# Patient Record
Sex: Female | Born: 1960 | Race: White | Hispanic: No | Marital: Married | Smoking: Former smoker
Health system: Southern US, Community
[De-identification: ages and names within clinical notes are randomized; demographics above are authoritative.]

## PROBLEM LIST (undated history)

## (undated) DIAGNOSIS — E785 Hyperlipidemia, unspecified: Secondary | ICD-10-CM

## (undated) DIAGNOSIS — H3321 Serous retinal detachment, right eye: Secondary | ICD-10-CM

## (undated) HISTORY — DX: Hyperlipidemia, unspecified: E78.5

## (undated) HISTORY — PX: OTHER SURGICAL HISTORY: SHX169

## (undated) HISTORY — PX: WISDOM TOOTH EXTRACTION: SHX21

## (undated) HISTORY — DX: Serous retinal detachment, right eye: H33.21

---

## 2014-09-21 ENCOUNTER — Telehealth: Payer: Self-pay | Admitting: *Deleted

## 2014-09-21 NOTE — Telephone Encounter (Signed)
Pre visit completed 

## 2014-09-23 ENCOUNTER — Ambulatory Visit (INDEPENDENT_AMBULATORY_CARE_PROVIDER_SITE_OTHER): Payer: 59 | Admitting: Family

## 2014-09-23 ENCOUNTER — Encounter: Payer: Self-pay | Admitting: Family

## 2014-09-23 ENCOUNTER — Ambulatory Visit (HOSPITAL_BASED_OUTPATIENT_CLINIC_OR_DEPARTMENT_OTHER)
Admission: RE | Admit: 2014-09-23 | Discharge: 2014-09-23 | Disposition: A | Discharge status: Home / Self Care | Payer: 59 | Source: Ambulatory Visit | Attending: Family | Admitting: Family

## 2014-09-23 VITALS — BP 110/70 | HR 73 | Temp 98.0°F | Resp 16 | Ht 65.0 in | Wt 224.2 lb

## 2014-09-23 DIAGNOSIS — Z1231 Encounter for screening mammogram for malignant neoplasm of breast: Secondary | ICD-10-CM | POA: Insufficient documentation

## 2014-09-23 DIAGNOSIS — E785 Hyperlipidemia, unspecified: Secondary | ICD-10-CM | POA: Diagnosis not present

## 2014-09-23 DIAGNOSIS — Z1211 Encounter for screening for malignant neoplasm of colon: Secondary | ICD-10-CM

## 2014-09-23 DIAGNOSIS — R011 Cardiac murmur, unspecified: Secondary | ICD-10-CM

## 2014-09-23 DIAGNOSIS — Z0000 Encounter for general adult medical examination without abnormal findings: Secondary | ICD-10-CM | POA: Diagnosis not present

## 2014-09-23 DIAGNOSIS — E2839 Other primary ovarian failure: Secondary | ICD-10-CM

## 2014-09-23 LAB — URINALYSIS, ROUTINE W REFLEX MICROSCOPIC
Bilirubin Urine: NEGATIVE
Hgb urine dipstick: NEGATIVE
Ketones, ur: NEGATIVE
Nitrite: POSITIVE — AB
SPECIFIC GRAVITY, URINE: 1.015 (ref 1.000–1.030)
TOTAL PROTEIN, URINE-UPE24: NEGATIVE
URINE GLUCOSE: NEGATIVE
Urobilinogen, UA: 0.2 (ref 0.0–1.0)
pH: 7.5 (ref 5.0–8.0)

## 2014-09-23 LAB — CBC WITH DIFFERENTIAL/PLATELET
BASOS ABS: 0.1 10*3/uL (ref 0.0–0.1)
BASOS PCT: 0.9 % (ref 0.0–3.0)
Eosinophils Absolute: 0.3 10*3/uL (ref 0.0–0.7)
Eosinophils Relative: 3.1 % (ref 0.0–5.0)
HCT: 45.6 % (ref 36.0–46.0)
Hemoglobin: 15.4 g/dL — ABNORMAL HIGH (ref 12.0–15.0)
LYMPHS PCT: 30.3 % (ref 12.0–46.0)
Lymphs Abs: 2.5 10*3/uL (ref 0.7–4.0)
MCHC: 33.8 g/dL (ref 30.0–36.0)
MCV: 90.3 fl (ref 78.0–100.0)
Monocytes Absolute: 0.6 10*3/uL (ref 0.1–1.0)
Monocytes Relative: 7.7 % (ref 3.0–12.0)
NEUTROS PCT: 58 % (ref 43.0–77.0)
Neutro Abs: 4.8 10*3/uL (ref 1.4–7.7)
Platelets: 338 10*3/uL (ref 150.0–400.0)
RBC: 5.05 Mil/uL (ref 3.87–5.11)
RDW: 13 % (ref 11.5–15.5)
WBC: 8.3 10*3/uL (ref 4.0–10.5)

## 2014-09-23 LAB — LIPID PANEL
Cholesterol: 193 mg/dL (ref 0–200)
HDL: 60.6 mg/dL (ref >39.00)
LDL CALC: 117 mg/dL — AB (ref 0–99)
NonHDL: 132.4
TRIGLYCERIDES: 75 mg/dL (ref 0.0–149.0)
Total CHOL/HDL Ratio: 3
VLDL: 15 mg/dL (ref 0.0–40.0)

## 2014-09-23 LAB — HEPATIC FUNCTION PANEL
ALT: 17 U/L (ref 0–35)
AST: 17 U/L (ref 0–37)
Albumin: 4.2 g/dL (ref 3.5–5.2)
Alkaline Phosphatase: 81 U/L (ref 39–117)
BILIRUBIN DIRECT: 0.1 mg/dL (ref 0.0–0.3)
Total Bilirubin: 0.7 mg/dL (ref 0.2–1.2)
Total Protein: 7.2 g/dL (ref 6.0–8.3)

## 2014-09-23 LAB — BASIC METABOLIC PANEL
BUN: 15 mg/dL (ref 6–23)
CALCIUM: 9.7 mg/dL (ref 8.4–10.5)
CO2: 30 mEq/L (ref 19–32)
CREATININE: 0.88 mg/dL (ref 0.40–1.20)
Chloride: 104 mEq/L (ref 96–112)
GFR: 70.96 mL/min (ref >60.00)
Glucose, Bld: 91 mg/dL (ref 70–99)
Potassium: 4.4 mEq/L (ref 3.5–5.1)
Sodium: 139 mEq/L (ref 135–145)

## 2014-09-23 LAB — TSH: TSH: 0.53 u[IU]/mL (ref 0.35–4.50)

## 2014-09-23 MED ORDER — SIMVASTATIN 20 MG PO TABS: 20.0000 mg | ORAL_TABLET | Freq: Every day | ORAL | Status: DC

## 2014-09-23 NOTE — Patient Instructions (Addendum)
Complete lab work prior to leaving.  Try downloading my fitness pal app and counting calories with goal weight loss of 1-2 pounds a week.  Add caltrate 600mg  + D twice daily Please complete lab work prior to leaving. You will be contacted about your bone density.  Please let us know if you have not heard back within 1 week about your referral. Schedule mammogram on the first floor in the imaging department.  Welcome to [facility]!

## 2014-09-23 NOTE — Progress Notes (Signed)
Pre visit review using our clinic review tool, if applicable. No additional management support is needed unless otherwise documented below in the visit note. 

## 2014-09-23 NOTE — Progress Notes (Signed)
Subjective:    Patient ID: Whitney Moore, female    DOB: 01/25/1961, 54 y.o.   MRN: [id number]  HPI  Ms.  Whitney Moore is a 54 yr old female who presents today to establish care.   Hyperlipidemia- Patient is currently maintained on the following medication for hyperlipidemia: simvastatin Last lipid panel as follows: Patient denies myalgia. Patient reports non-compliance with low fat/low cholesterol diet.  BP Readings from Last 3 Encounters:  09/23/14 110/70    Immunizations: tetanus 7 yrs ago Diet: needs to be improved.   Exercise:walking Colonoscopy: never (wished to defer for now due to finances) Dexa: never, LMP 51  Pap Smear: 2014- normal per pt Mammogram: 2014- due Dental: up to date Eye: exam 11/15   Weight management- Reports that she her weight was 185-190 3-4 years ago. Wants to lose 30 pounds by august for trip to [place].  She reports that she lost 7 pounds when taking wellbutrin during the holidays.   Wt Readings from Last 3 Encounters:  09/23/14 224 lb 3.2 oz (101.696 kg)    Murmur-  Reports "since I was a child."  Mother has hx of valvular heart disease.  Reports she has not had an echo in "years."    Review of Systems  Constitutional: Negative for unexpected weight change.  HENT: Negative for hearing loss and rhinorrhea.   Eyes: Negative for visual disturbance.  Respiratory: Negative for cough and shortness of breath.   Cardiovascular: Negative for leg swelling.  Gastrointestinal: Negative for nausea, diarrhea and constipation.  Genitourinary: Negative for dysuria and frequency.       Reports mild yeast infection- using monistat  Musculoskeletal: Negative for myalgias and arthralgias.  Skin: Negative for rash.  Neurological:       Reports rare migraine (every few years)  Hematological: Negative for adenopathy.  Psychiatric/Behavioral: Negative for dysphoric mood and agitation.   Past Medical History  Diagnosis Date  . Hyperlipidemia     History    Social History  . Marital Status: Married    Spouse Name: N/A  . Number of Children: N/A  . Years of Education: N/A   Occupational History  . Not on file.   Social History Main Topics  . Smoking status: Former Smoker    Quit date: 09/22/1997  . Smokeless tobacco: Never Used  . Alcohol Use: No  . Drug Use: No  . Sexual Activity: Not on file   Other Topics Concern  . 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 Wisdom tooth extraction      Family History  Problem Relation Age of Onset  . Heart disease Mother     metal heart valve, pacemaker    No Known Allergies  No current outpatient prescriptions on file prior to visit.   No current facility-administered medications on file prior to visit.    BP 110/70 mmHg  Pulse 73  Temp(Src) 98 F (36.7 C) (Oral)  Resp 16  Ht 5\' 5"  (1.651 m)  Wt 224 lb 3.2 oz (101.696 kg)  BMI 37.31 kg/m2  SpO2 98%  LMP 09/23/2011       Objective:   Physical Exam Physical Exam  Constitutional: She is oriented to person, place, and time. She appears well-developed and well-nourished. No distress.  HENT:  Head: Normocephalic and atraumatic.  Right Ear: Tympanic membrane and ear canal normal.  Left Ear: Tympanic membrane and ear canal normal.  Mouth/Throat: Oropharynx is clear and moist.  Eyes: Pupils are equal, round, and reactive to light. No scleral icterus.  Neck: Normal range of motion. No thyromegaly present.  Cardiovascular: Normal rate and regular rhythm. 1+ systolic murmur Pulmonary/Chest: Effort normal and breath sounds normal. No respiratory distress. He has no wheezes. She has no rales. She exhibits no tenderness.  Abdominal: Soft. Bowel sounds are normal. He exhibits no distension and no mass. There is no tenderness. There is no rebound and no guarding.   Musculoskeletal: She exhibits no edema.  Lymphadenopathy:    She has no cervical adenopathy.  Neurological: She is alert and oriented to person, place, and time. She has normal reflexes. She exhibits normal muscle tone. Coordination normal.  Skin: Skin is warm and dry. tanned Psychiatric: She has a normal mood and affect. Her behavior is normal. Judgment and thought content normal.  Breasts: Examined lying Right: Without masses, retractions, discharge or axillary adenopathy.  Left: Without masses, retractions, discharge or axillary adenopathy.  Pelvic: deferred       Assessment & Plan:          Assessment & Plan:

## 2014-09-23 NOTE — Assessment & Plan Note (Addendum)
Immunizations reviewed and up to date.  discussed healthy diet, exercise, weight loss.  Obtain routine labs. Declines HIV screen.  Discussed using my fitness pal to assist with weigh loss.  Refer for mammo, dexa, declines colo at this time.  Will obtain IFOB.

## 2014-09-23 NOTE — Assessment & Plan Note (Signed)
Will obtain 2D echo to further evaluate.

## 2014-09-23 NOTE — Assessment & Plan Note (Addendum)
Obtain lipid panel. Continue statin.  

## 2014-09-25 ENCOUNTER — Encounter: Payer: Self-pay | Admitting: Family

## 2014-09-26 ENCOUNTER — Ambulatory Visit (INDEPENDENT_AMBULATORY_CARE_PROVIDER_SITE_OTHER): Payer: 59 | Admitting: Family

## 2014-09-26 ENCOUNTER — Encounter: Payer: Self-pay | Admitting: Family

## 2014-09-26 ENCOUNTER — Other Ambulatory Visit (HOSPITAL_COMMUNITY)
Admission: RE | Admit: 2014-09-26 | Discharge: 2014-09-26 | Disposition: A | Discharge status: Home / Self Care | Payer: 59 | Source: Ambulatory Visit | Attending: Family | Admitting: Family

## 2014-09-26 VITALS — BP 110/78 | HR 80 | Temp 98.1°F | Resp 16 | Ht 65.0 in | Wt 226.0 lb

## 2014-09-26 DIAGNOSIS — R350 Frequency of micturition: Secondary | ICD-10-CM

## 2014-09-26 DIAGNOSIS — L298 Other pruritus: Secondary | ICD-10-CM

## 2014-09-26 DIAGNOSIS — N760 Acute vaginitis: Secondary | ICD-10-CM | POA: Insufficient documentation

## 2014-09-26 DIAGNOSIS — N898 Other specified noninflammatory disorders of vagina: Secondary | ICD-10-CM

## 2014-09-26 NOTE — Progress Notes (Signed)
   Subjective:    Patient ID: Whitney RousselRebecca Moore, female    DOB: 10/30/1960, 54 y.o.   MRN: [id number]  HPI   Ms. Whitney Moore is a 54 yr old female who presents today with complaint of vaginal itching despite monistat. Denies odor.  Denies dysuria, fever, dysuria or hematuria.     Review of Systems See HPI  Past Medical History  Diagnosis Date  . Hyperlipidemia     History   Social History  . Marital Status: Married    Spouse Name: N/A  . Number of Children: N/A  . Years of Education: N/A   Occupational History  . Not on file.   Social History Main Topics  . Smoking status: Former Smoker    Quit date: 09/22/1997  . Smokeless tobacco: Never Used  . Alcohol Use: No  . Drug Use: No  . Sexual Activity: Not on file   Other Topics Concern  . 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 Wisdom tooth extraction      Family History  Problem Relation Age of Onset  . Heart disease Mother     metal heart valve, pacemaker    No Known Allergies  Current Outpatient Prescriptions on File Prior to Visit  Medication Sig Dispense Refill  . fexofenadine (ALLEGRA) 180 MG tablet Take 180 mg by mouth daily.    . simvastatin (ZOCOR) 20 MG tablet Take 1 tablet (20 mg total) by mouth at bedtime. 90 tablet 1   No current facility-administered medications on file prior to visit.    BP 110/78 mmHg  Pulse 80  Temp(Src) 98.1 F (36.7 C) (Oral)  Resp 16  Ht 5\' 5"  (1.651 m)  Wt 226 lb (102.513 kg)  BMI 37.61 kg/m2  SpO2 98%  LMP 09/23/2011       Objective:   Physical Exam  Constitutional: She appears well-developed and well-nourished.  Cardiovascular: Normal rate, regular rhythm and normal heart sounds.   No murmur heard. Pulmonary/Chest: Effort normal and breath sounds normal. No respiratory distress. She has no  wheezes.  Genitourinary: There is no rash on the right labia. There is no rash on the left labia. No vaginal discharge found.  Psychiatric: She has a normal mood and affect. Her behavior is normal. Judgment and thought content normal.          Assessment & Plan:

## 2014-09-26 NOTE — Telephone Encounter (Signed)
I would recommend she come in for a wet prep so we can see if it is yeast or not. If monistat does not work then we need to treat with different med.  It would also be a good idea to get a urine culture since ua showed possible UTI.

## 2014-09-26 NOTE — Progress Notes (Signed)
Pre visit review using our clinic review tool, if applicable. No additional management support is needed unless otherwise documented below in the visit note. 

## 2014-09-26 NOTE — Telephone Encounter (Signed)
See previous phone note. Pt will see Provider today at 6pm.

## 2014-09-26 NOTE — Telephone Encounter (Signed)
Left message on cell# for pt to call and arrange appt.

## 2014-09-26 NOTE — Patient Instructions (Signed)
We will contact you with your lab results. 

## 2014-09-26 NOTE — Telephone Encounter (Signed)
Notified pt and she scheduled appt for this evening at 6pm.

## 2014-09-28 ENCOUNTER — Encounter: Payer: Self-pay | Admitting: Family

## 2014-09-28 LAB — CERVICOVAGINAL ANCILLARY ONLY: Wet Prep (BD Affirm): NEGATIVE

## 2014-09-29 DIAGNOSIS — N760 Acute vaginitis: Secondary | ICD-10-CM | POA: Insufficient documentation

## 2014-09-29 LAB — URINE CULTURE: Colony Count: 9000

## 2014-09-29 NOTE — Assessment & Plan Note (Signed)
Wet prep negative.  ? Recent yeast infection which is now resolved following monistat.  Another consideration is irritation secondary to vaginal atrophy. Pt to call if symptoms worsen or do not improve.

## 2014-09-30 ENCOUNTER — Encounter: Payer: Self-pay | Admitting: Family

## 2014-09-30 MED ORDER — CIPROFLOXACIN HCL 500 MG PO TABS: 500.0000 mg | ORAL_TABLET | Freq: Two times a day (BID) | ORAL | Status: DC

## 2015-04-24 ENCOUNTER — Other Ambulatory Visit: Payer: Self-pay | Admitting: Family

## 2015-04-24 NOTE — Telephone Encounter (Signed)
Please call pt to schedule fasting physical before further refills are due.  Thanks!

## 2015-04-24 NOTE — Telephone Encounter (Signed)
Looks like she is due for CPX at her convenience.

## 2015-04-24 NOTE — Telephone Encounter (Signed)
Whitney Moore-- just sent a 90 day supply of simvastatin to pharmacy. Pt's last visit 09/2014. When should pt follow up in the office?

## 2015-04-25 NOTE — Telephone Encounter (Signed)
Left msg for pt to call and schedule fasting cpe

## 2015-08-09 ENCOUNTER — Telehealth: Payer: 59 | Admitting: Nurse Practitioner

## 2015-08-09 DIAGNOSIS — R059 Cough, unspecified: Secondary | ICD-10-CM

## 2015-08-09 DIAGNOSIS — R05 Cough: Secondary | ICD-10-CM

## 2015-08-09 MED ORDER — BENZONATATE 100 MG PO CAPS: 100.0000 mg | ORAL_CAPSULE | Freq: Three times a day (TID) | ORAL | Status: DC | PRN

## 2015-08-09 MED ORDER — AZITHROMYCIN 250 MG PO TABS: ORAL_TABLET | ORAL | Status: DC

## 2015-08-09 NOTE — Progress Notes (Signed)

## 2015-08-16 ENCOUNTER — Ambulatory Visit (INDEPENDENT_AMBULATORY_CARE_PROVIDER_SITE_OTHER): Payer: Commercial Managed Care - HMO | Admitting: Family

## 2015-08-16 ENCOUNTER — Encounter: Payer: Self-pay | Admitting: Family

## 2015-08-16 VITALS — BP 110/76 | HR 77 | Temp 97.7°F | Ht 65.0 in | Wt 221.6 lb

## 2015-08-16 DIAGNOSIS — E785 Hyperlipidemia, unspecified: Secondary | ICD-10-CM | POA: Diagnosis not present

## 2015-08-16 DIAGNOSIS — J209 Acute bronchitis, unspecified: Secondary | ICD-10-CM

## 2015-08-16 MED ORDER — SIMVASTATIN 20 MG PO TABS: ORAL_TABLET | ORAL | Status: DC

## 2015-08-16 NOTE — Progress Notes (Signed)
Pre visit review using our clinic review tool, if applicable. No additional management support is needed unless otherwise documented below in the visit note. 

## 2015-08-16 NOTE — Assessment & Plan Note (Signed)
Tolerating statin, working on diet, exercise, weight loss.

## 2015-08-16 NOTE — Patient Instructions (Signed)
Please schedule a complete physical after 09/23/15. Continue simvastatin.

## 2015-08-16 NOTE — Progress Notes (Signed)
   Subjective:    Patient ID: Whitney Moore, female    DOB: 11/30/1960, 55 y.o.   MRN: [id number]  HPI  Ms. Whitney Moore is a 55 yr old female who presents today for follow up of her cholesterol. She is maintained on simvastatin. Denies myalgia.  Reports that she is trying to work on diet.  She has joined the gym. Denies myalgia.  Lab Results  Component Value Date   CHOL 193 09/23/2014   HDL 60.60 09/23/2014   LDLCALC 117* 09/23/2014   TRIG 75.0 09/23/2014   CHOLHDL 3 09/23/2014     Had E-visit on 08/09/15 for bronchitis- was treated with zpak and tessalon. Reports improvement in her symptoms.   Review of Systems See HPI  Past Medical History  Diagnosis Date  . Hyperlipidemia     Social History   Social History  . Marital Status: Married    Spouse Name: N/A  . Number of Children: N/A  . Years of Education: N/A   Occupational History  . Not on file.   Social History Main Topics  . Smoking status: Former Smoker    Quit date: 09/22/1997  . Smokeless tobacco: Never Used  . Alcohol Use: No  . Drug Use: No  . Sexual Activity: Not on file   Other Topics Concern  . 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 Wisdom tooth extraction      Family History  Problem Relation Age of Onset  . Heart disease Mother     metal heart valve, pacemaker    No Known Allergies  Current Outpatient Prescriptions on File Prior to Visit  Medication Sig Dispense Refill  . benzonatate (TESSALON PERLES) 100 MG capsule Take 1 capsule (100 mg total) by mouth 3 (three) times daily as needed for cough. 20 capsule 0   No current facility-administered medications on file prior to visit.    BP 110/76 mmHg  Pulse 77  Temp(Src) 97.7 F (36.5 C) (Oral)  Ht 5\' 5"  (1.651 m)  Wt 221 lb 9.6 oz (100.517 kg)  BMI 36.88 kg/m2  SpO2  98%  LMP 09/23/2011       Objective:   Physical Exam  Constitutional: She is oriented to person, place, and time. She appears well-developed and well-nourished.  Cardiovascular: Normal rate, regular rhythm and normal heart sounds.   No murmur heard. Pulmonary/Chest: Effort normal and breath sounds normal. No respiratory distress. She has no wheezes.  Neurological: She is alert and oriented to person, place, and time.  Psychiatric: She has a normal mood and affect. Her behavior is normal. Judgment and thought content normal.          Assessment & Plan:  Acute bronchitis- clinically resolved.  Monitor.

## 2016-02-06 ENCOUNTER — Telehealth: Payer: Self-pay | Admitting: Family

## 2016-02-06 NOTE — Telephone Encounter (Signed)
Please schedule pt an annual exam Thank you----PC

## 2016-02-08 NOTE — Telephone Encounter (Signed)
Called pt to schedule cpe. Spouse took a message, pt will call our office back.

## 2016-05-04 ENCOUNTER — Other Ambulatory Visit: Payer: Self-pay | Admitting: Family

## 2016-05-06 NOTE — Telephone Encounter (Signed)
Last seen 08/16/15 Refilled Rx #90 tablets #0 refills until patient follow up after 09/23/15. TL/CMA

## 2016-07-29 ENCOUNTER — Other Ambulatory Visit: Payer: Self-pay | Admitting: Family Medicine

## 2016-10-25 ENCOUNTER — Other Ambulatory Visit: Payer: Self-pay | Admitting: Family

## 2016-12-12 ENCOUNTER — Other Ambulatory Visit (HOSPITAL_COMMUNITY)
Admission: RE | Admit: 2016-12-12 | Discharge: 2016-12-12 | Disposition: A | Discharge status: Home / Self Care | Payer: 59 | Source: Ambulatory Visit | Attending: Family | Admitting: Family

## 2016-12-12 ENCOUNTER — Encounter: Payer: Self-pay | Admitting: Family

## 2016-12-12 ENCOUNTER — Ambulatory Visit (INDEPENDENT_AMBULATORY_CARE_PROVIDER_SITE_OTHER): Payer: 59 | Admitting: Family

## 2016-12-12 VITALS — BP 122/88 | HR 78 | Temp 98.1°F | Resp 16 | Ht 64.5 in | Wt 218.4 lb

## 2016-12-12 DIAGNOSIS — R011 Cardiac murmur, unspecified: Secondary | ICD-10-CM

## 2016-12-12 DIAGNOSIS — Z01419 Encounter for gynecological examination (general) (routine) without abnormal findings: Secondary | ICD-10-CM

## 2016-12-12 DIAGNOSIS — E348 Other specified endocrine disorders: Secondary | ICD-10-CM

## 2016-12-12 DIAGNOSIS — Z0001 Encounter for general adult medical examination with abnormal findings: Secondary | ICD-10-CM

## 2016-12-12 DIAGNOSIS — Z0000 Encounter for general adult medical examination without abnormal findings: Secondary | ICD-10-CM

## 2016-12-12 LAB — BASIC METABOLIC PANEL
BUN: 18 mg/dL (ref 6–23)
CHLORIDE: 105 meq/L (ref 96–112)
CO2: 29 meq/L (ref 19–32)
Calcium: 9.8 mg/dL (ref 8.4–10.5)
Creatinine, Ser: 0.84 mg/dL (ref 0.40–1.20)
GFR: 74.27 mL/min (ref >60.00)
GLUCOSE: 92 mg/dL (ref 70–99)
POTASSIUM: 3.9 meq/L (ref 3.5–5.1)
SODIUM: 139 meq/L (ref 135–145)

## 2016-12-12 LAB — LIPID PANEL
CHOLESTEROL: 190 mg/dL (ref 0–200)
HDL: 65.4 mg/dL (ref >39.00)
LDL Cholesterol: 106 mg/dL — ABNORMAL HIGH (ref 0–99)
NonHDL: 124.57
Total CHOL/HDL Ratio: 3
Triglycerides: 94 mg/dL (ref 0.0–149.0)
VLDL: 18.8 mg/dL (ref 0.0–40.0)

## 2016-12-12 LAB — CBC WITH DIFFERENTIAL/PLATELET
BASOS PCT: 0.9 % (ref 0.0–3.0)
Basophils Absolute: 0.1 10*3/uL (ref 0.0–0.1)
EOS PCT: 2.1 % (ref 0.0–5.0)
Eosinophils Absolute: 0.2 10*3/uL (ref 0.0–0.7)
HCT: 45 % (ref 36.0–46.0)
Hemoglobin: 15.2 g/dL — ABNORMAL HIGH (ref 12.0–15.0)
LYMPHS ABS: 2.1 10*3/uL (ref 0.7–4.0)
Lymphocytes Relative: 25.1 % (ref 12.0–46.0)
MCHC: 33.7 g/dL (ref 30.0–36.0)
MCV: 93.2 fl (ref 78.0–100.0)
MONOS PCT: 7 % (ref 3.0–12.0)
Monocytes Absolute: 0.6 10*3/uL (ref 0.1–1.0)
NEUTROS ABS: 5.4 10*3/uL (ref 1.4–7.7)
NEUTROS PCT: 64.9 % (ref 43.0–77.0)
Platelets: 320 10*3/uL (ref 150.0–400.0)
RBC: 4.83 Mil/uL (ref 3.87–5.11)
RDW: 13.4 % (ref 11.5–15.5)
WBC: 8.4 10*3/uL (ref 4.0–10.5)

## 2016-12-12 LAB — URINALYSIS, ROUTINE W REFLEX MICROSCOPIC
Bilirubin Urine: NEGATIVE
Hgb urine dipstick: NEGATIVE
Ketones, ur: NEGATIVE
Leukocytes, UA: NEGATIVE
Nitrite: NEGATIVE
RBC / HPF: NONE SEEN (ref 0–?)
SPECIFIC GRAVITY, URINE: 1.015 (ref 1.000–1.030)
Total Protein, Urine: NEGATIVE
Urine Glucose: NEGATIVE
Urobilinogen, UA: 0.2 (ref 0.0–1.0)
pH: 7 (ref 5.0–8.0)

## 2016-12-12 LAB — HEPATIC FUNCTION PANEL
ALBUMIN: 4.2 g/dL (ref 3.5–5.2)
ALT: 17 U/L (ref 0–35)
AST: 16 U/L (ref 0–37)
Alkaline Phosphatase: 75 U/L (ref 39–117)
Bilirubin, Direct: 0.1 mg/dL (ref 0.0–0.3)
Total Bilirubin: 0.7 mg/dL (ref 0.2–1.2)
Total Protein: 7.1 g/dL (ref 6.0–8.3)

## 2016-12-12 LAB — TSH: TSH: 0.35 u[IU]/mL (ref 0.35–4.50)

## 2016-12-12 MED ORDER — CALCIUM CARBONATE-VITAMIN D 600-400 MG-UNIT PO TABS: 1.0000 | ORAL_TABLET | Freq: Two times a day (BID) | ORAL | Status: DC

## 2016-12-12 NOTE — Progress Notes (Signed)
Subjective:    Patient ID: Sharol RousselRebecca Nealis, female    DOB: 07/21/1960, 56 y.o.   MRN: [id number]  HPI  Patient presents today for complete physical.  Immunizations: due for tetanus Diet: weight watchers, healthy diet Exercise:  walking Colonoscopy: due Dexa: due Pap Smear: due Mammogram: due Dental: up to date Vision:  1/18    Review of Systems  Constitutional: Negative for unexpected weight change.  HENT: Negative for hearing loss and rhinorrhea.   Eyes: Negative for visual disturbance.  Respiratory: Negative for shortness of breath.        Mild cough due to allergies  Cardiovascular: Negative for chest pain and leg swelling.  Gastrointestinal: Negative for constipation.  Genitourinary: Negative for dysuria and frequency.  Musculoskeletal: Negative for arthralgias and myalgias.  Skin: Negative for rash.  Hematological: Negative for adenopathy.  Psychiatric/Behavioral:       Denies depression   Past Medical History:  Diagnosis Date  . Hyperlipidemia      Social History   Social History  . Marital status: Married    Spouse name: N/A  . Number of children: N/A  . Years of education: N/A   Occupational History  . Not on file.   Social History Main Topics  . Smoking status: Former Smoker    Quit date: 09/22/1997  . Smokeless tobacco: Never Used  . Alcohol use No  . Drug use: No  . Sexual activity: Not on file   Other Topics Concern  . 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 WISDOM TOOTH EXTRACTION      Family History  Problem Relation Age of Onset  . Heart disease Mother        metal heart valve, pacemaker  . Dementia Mother     No Known Allergies  Current Outpatient Prescriptions on File Prior to Visit  Medication Sig Dispense Refill  . simvastatin (ZOCOR) 20 MG tablet  TAKE 1 TABLET(20 MG) BY MOUTH AT BEDTIME 90 tablet 0   No current facility-administered medications on file prior to visit.     BP 122/88 (BP Location: Right Arm, Cuff Size: Large)   Pulse 78   Temp 98.1 F (36.7 C) (Oral)   Resp 16   Ht 5' 4.5" (1.638 m)   Wt 218 lb 6.4 oz (99.1 kg)   LMP 09/23/2011   BMI 36.91 kg/m       Objective:   Physical Exam Physical Exam  Constitutional: She is oriented to person, place, and time. She appears well-developed and well-nourished. No distress.  HENT:  Head: Normocephalic and atraumatic.  Right Ear: Tympanic membrane and ear canal normal.  Left Ear: Tympanic membrane and ear canal normal.  Mouth/Throat: Oropharynx is clear and moist.  Eyes: Pupils are equal, round, and reactive to light. No scleral icterus.  Neck: Normal range of motion. No thyromegaly present.  Cardiovascular: Normal rate and regular rhythm.   2+ systolic  murmur heard. Pulmonary/Chest: Effort normal and breath sounds normal. No respiratory distress. He has no wheezes. She has no rales. She exhibits no tenderness.  Abdominal: Soft. Bowel sounds are normal. She exhibits no distension and no mass. There is no tenderness. There is no rebound and no guarding.  Musculoskeletal: She exhibits no edema.  Lymphadenopathy:    She has no cervical  adenopathy.  Neurological: She is alert and oriented to person, place, and time. She has normal patellar reflexes. She exhibits normal muscle tone. Coordination normal.  Skin: Skin is warm and dry.  Psychiatric: She has a normal mood and affect. Her behavior is normal. Judgment and thought content normal.  Breasts: Examined lying Right: Without masses, retractions, discharge or axillary adenopathy.  Left: Without masses, retractions, discharge or axillary adenopathy.  Inguinal/mons: Normal without inguinal adenopathy  External genitalia: Normal  BUS/Urethra/Skene's glands: Normal  Bladder: Normal  Vagina: Normal  Cervix: Normal    Uterus: normal in size, shape and contour. Midline and mobile  Adnexa/parametria:  Rt: Without masses or tenderness.  Lt: Without masses or tenderness.  Anus and perineum: Normal            Assessment & Plan:        Assessment & Plan:  Preventative care- She states that she has lost 20 pounds. I encouraged her to continue her healthy diet exercise and weight loss efforts. Will obtain routine lab work. We'll also refer for colonoscopy, mammogram, and DEXA scan. She declines tetanus today. We do not have the shingles vaccine and stop, I have advised her to contact us in a few months to see if it's in [place]. Pap was performed today and will be sent for co- testing. Wt Readings from Last 3 Encounters:  12/12/16 218 lb 6.4 oz (99.1 kg)  08/16/15 221 lb 9.6 oz (100.5 kg)  09/26/14 226 lb (102.5 kg)    EKG tracing is personally reviewed.  EKG notes NSR.  No acute changes.   Heart murmur-does not appear that she follow through with her echo back in 2016. Will reorder today.

## 2016-12-12 NOTE — Patient Instructions (Addendum)
Please complete lab work prior to leaving. Scheduled mammogram and bone density on the first floor in our imaging Department. He should be contacted about your referral for colonoscopy as well as an echocardiogram to check your heart. Please let us know if he has not heard back about these appointments within 1 week. Keep up the good work with healthy diet, exercise, and weight loss. Please call us or send us a mychart message  in a few months to see if we have the shingles vaccine in stock.

## 2016-12-12 NOTE — Addendum Note (Signed)
Addended by: Mervin KungFERGERSON, Tige Meas A on: 12/12/2016 11:43 AM   Modules accepted: Orders

## 2016-12-15 ENCOUNTER — Other Ambulatory Visit: Payer: Self-pay | Admitting: Family

## 2016-12-16 LAB — CYTOLOGY - PAP
DIAGNOSIS: NEGATIVE
HPV (WINDOPATH): NOT DETECTED

## 2016-12-16 MED ORDER — SIMVASTATIN 20 MG PO TABS: 20.0000 mg | ORAL_TABLET | Freq: Every day | ORAL | 1 refills | Status: DC

## 2016-12-25 ENCOUNTER — Other Ambulatory Visit (HOSPITAL_BASED_OUTPATIENT_CLINIC_OR_DEPARTMENT_OTHER): Payer: Self-pay

## 2017-01-07 ENCOUNTER — Ambulatory Visit (HOSPITAL_BASED_OUTPATIENT_CLINIC_OR_DEPARTMENT_OTHER)
Admission: RE | Admit: 2017-01-07 | Discharge: 2017-01-07 | Disposition: A | Discharge status: Home / Self Care | Payer: 59 | Source: Ambulatory Visit | Attending: Family | Admitting: Family

## 2017-01-07 DIAGNOSIS — Z1231 Encounter for screening mammogram for malignant neoplasm of breast: Secondary | ICD-10-CM | POA: Diagnosis not present

## 2017-01-07 DIAGNOSIS — Z0000 Encounter for general adult medical examination without abnormal findings: Secondary | ICD-10-CM

## 2017-01-07 DIAGNOSIS — E348 Other specified endocrine disorders: Secondary | ICD-10-CM

## 2017-01-14 ENCOUNTER — Encounter: Payer: Self-pay | Admitting: Family

## 2017-01-22 ENCOUNTER — Other Ambulatory Visit (HOSPITAL_BASED_OUTPATIENT_CLINIC_OR_DEPARTMENT_OTHER): Payer: Self-pay

## 2017-01-29 ENCOUNTER — Ambulatory Visit (HOSPITAL_BASED_OUTPATIENT_CLINIC_OR_DEPARTMENT_OTHER): Payer: 59

## 2017-06-02 ENCOUNTER — Other Ambulatory Visit: Payer: Self-pay | Admitting: Family

## 2017-06-04 ENCOUNTER — Encounter: Payer: Self-pay | Admitting: Family

## 2017-06-04 MED ORDER — EFLORNITHINE HCL 13.9 % EX CREA: 1.0000 "application " | TOPICAL_CREAM | Freq: Two times a day (BID) | CUTANEOUS | 0 refills | Status: DC

## 2017-08-28 ENCOUNTER — Other Ambulatory Visit: Payer: Self-pay | Admitting: Family

## 2017-09-04 ENCOUNTER — Telehealth: Payer: Self-pay | Admitting: *Deleted

## 2017-09-04 MED ORDER — SIMVASTATIN 20 MG PO TABS: ORAL_TABLET | ORAL | 0 refills | Status: DC

## 2017-09-04 NOTE — Telephone Encounter (Signed)
Received request from [facility] for simvastatin. Pt last seen 12/2016 and advised to follow up in 12/2017. Refill sent.

## 2017-10-28 ENCOUNTER — Telehealth: Payer: Self-pay | Admitting: Family

## 2017-10-28 NOTE — Telephone Encounter (Signed)
Called  Pt and LVM informing that her refill request was approved, but she was due for a CPE. Advised for her to call [facility] and schedule an appt at her earliest [place]

## 2017-10-28 NOTE — Telephone Encounter (Signed)
Simvastatin refill sent to pharmacy. Pt is due for physical on 12/12/17.  Please call pt to schedule appointment. Thanks!

## 2018-03-14 ENCOUNTER — Other Ambulatory Visit: Payer: Self-pay | Admitting: Family

## 2018-03-30 ENCOUNTER — Other Ambulatory Visit: Payer: Self-pay | Admitting: Family

## 2018-03-31 MED ORDER — SIMVASTATIN 20 MG PO TABS: ORAL_TABLET | ORAL | 0 refills | Status: DC

## 2018-03-31 NOTE — Telephone Encounter (Signed)
Simvastatin request to [facility] denied as pt was due for cpe in July and is past due. Voice message was left for pt to call and schedule OV on 10/28/17 and appointment has not been scheduled yet. Sent 30 day supply to [facility] and sent mychart message to pt to schedule OV soon.

## 2018-06-10 ENCOUNTER — Encounter: Payer: Self-pay | Admitting: Family

## 2018-06-12 ENCOUNTER — Ambulatory Visit (INDEPENDENT_AMBULATORY_CARE_PROVIDER_SITE_OTHER): Payer: Commercial Managed Care - PPO | Admitting: Family

## 2018-06-12 ENCOUNTER — Encounter: Payer: Self-pay | Admitting: Family

## 2018-06-12 VITALS — BP 121/65 | HR 73 | Temp 98.4°F | Resp 16 | Ht 65.0 in | Wt 219.0 lb

## 2018-06-12 DIAGNOSIS — F419 Anxiety disorder, unspecified: Secondary | ICD-10-CM

## 2018-06-12 DIAGNOSIS — F32A Depression, unspecified: Secondary | ICD-10-CM

## 2018-06-12 DIAGNOSIS — F329 Major depressive disorder, single episode, unspecified: Secondary | ICD-10-CM | POA: Diagnosis not present

## 2018-06-12 MED ORDER — SERTRALINE HCL 50 MG PO TABS: ORAL_TABLET | ORAL | 0 refills | Status: DC

## 2018-06-12 NOTE — Patient Instructions (Signed)
Please begin zoloft 1/2 tab once daily at bedtime for 1 week, then increase to a full tab once daily on week two.  

## 2018-06-12 NOTE — Progress Notes (Signed)
Subjective:    Patient ID: Whitney Moore, female    DOB: 1961/01/04, 58 y.o.   MRN: [id number]  HPI  Patient is a 58 yr old female who presents today to discus anxiety.  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  She is having to re-apply for her job. Reports that she has done this 4 times previously but this time she just doesn't seem to be handling it well. Not sleeping well. Feel like I want to throw up. Poor appetite.   Husband helps with her mom who has dementia and lives with her.  Husband is very supportive.  Declines flu shot.   Review of Systems See HPI  Past Medical History:  Diagnosis Date  . Hyperlipidemia      Social History   Socioeconomic History  . Marital status: Married    Spouse name: Not on file  . Number of children: Not on file  . Years of education: Not on file  . Highest education level: Not on file  Occupational History  . Not on file  Social Needs  . Financial resource strain: Not on file  . Food insecurity:    Worry: Not on file    Inability: Not on file  . Transportation needs:    Medical: Not on file    Non-medical: Not on file  Tobacco Use  . Smoking status: Former Smoker    Last attempt to quit: 09/22/1997    Years since quitting: 20.7  . Smokeless tobacco: Never Used  Substance and Sexual Activity  . Alcohol use: No  . Drug use: No  . Sexual activity: Not on file  Lifestyle  . Physical activity:    Days per week: Not on file    Minutes per session: Not on file  . Stress: Not on file  Relationships  . Social connections:    Talks on phone: Not on file    Gets together: Not on file    Attends religious service: Not on file    Active member of club or organization: Not on file    Attends meetings of clubs or organizations: Not on file    Relationship status: Not on file  . Intimate partner violence:    Fear of current or ex partner: Not on file    Emotionally abused: Not on file    Physically  abused: Not on file    Forced sexual activity: Not on file  Other Topics Concern  . 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 WISDOM TOOTH EXTRACTION      Family History  Problem Relation Age of Onset  . Heart disease Mother        metal heart valve, pacemaker  . Dementia Mother     No Known Allergies  Current Outpatient Medications on File Prior to Visit  Medication Sig Dispense Refill  . Multiple Vitamin (MULTIVITAMIN) tablet Take 1 tablet by mouth daily.    . simvastatin (ZOCOR) 20 MG tablet TAKE 1 TABLET BY MOUTH AT  BEDTIME 30 tablet 0   No current facility-administered medications on file prior to visit.     BP 121/65 (BP Location: Right Arm, Patient Position: Sitting, Cuff Size: Large)   Pulse 73   Temp 98.4 F (36.9 C) (Oral)  Resp 16   Ht 5\' 5"  (1.651 m)   Wt 219 lb (99.3 kg)   LMP 09/23/2011   SpO2 98%   BMI 36.44 kg/m       Objective:   Physical Exam Constitutional:      Appearance: Normal appearance.  Neurological:     Mental Status: She is alert.  Psychiatric:        Behavior: Behavior normal.        Thought Content: Thought content normal.        Judgment: Judgment normal.     Comments: tearful           Assessment & Plan:  Anxiety/depression- suggested that she schedule with a counselor. Will also initiate zoloft 50mg .  I instructed pt to start 1/2 tablet once daily for 1 week and then increase to a full tablet once daily on week two as tolerated.  We discussed common side effects such as nausea, drowsiness and weight gain.  Also discussed rare but serious side effect of suicide ideation.  She is instructed to discontinue medication go directly to ED if this occurs.  Pt verbalizes understanding.  Plan follow up in 1 month to evaluate progress.     A total of 15  minutes  were spent face-to-face with the patient during this encounter and over half of that time was spent on counseling and coordination of care. The patient was counseled on anxiety/depression and their treatment.

## 2018-06-17 ENCOUNTER — Telehealth: Payer: Self-pay | Admitting: Family

## 2018-06-17 MED ORDER — VENLAFAXINE HCL ER 37.5 MG PO CP24: ORAL_CAPSULE | ORAL | 0 refills | Status: DC

## 2018-06-17 NOTE — Telephone Encounter (Signed)
Pt came with mother to mother's appointment.  Noted insomnia on zoloft. Will rx with effexor instead.

## 2018-07-07 ENCOUNTER — Encounter: Payer: Self-pay | Admitting: Family

## 2018-07-07 ENCOUNTER — Ambulatory Visit (INDEPENDENT_AMBULATORY_CARE_PROVIDER_SITE_OTHER): Payer: Commercial Managed Care - PPO | Admitting: Family

## 2018-07-07 VITALS — BP 117/79 | HR 89 | Temp 98.4°F | Resp 16 | Ht 65.0 in | Wt 219.0 lb

## 2018-07-07 DIAGNOSIS — Z1159 Encounter for screening for other viral diseases: Secondary | ICD-10-CM

## 2018-07-07 DIAGNOSIS — Z23 Encounter for immunization: Secondary | ICD-10-CM | POA: Diagnosis not present

## 2018-07-07 DIAGNOSIS — F418 Other specified anxiety disorders: Secondary | ICD-10-CM | POA: Diagnosis not present

## 2018-07-07 DIAGNOSIS — Z0000 Encounter for general adult medical examination without abnormal findings: Secondary | ICD-10-CM | POA: Diagnosis not present

## 2018-07-07 DIAGNOSIS — Z114 Encounter for screening for human immunodeficiency virus [HIV]: Secondary | ICD-10-CM

## 2018-07-07 LAB — CBC WITH DIFFERENTIAL/PLATELET
Basophils Absolute: 0.1 10*3/uL (ref 0.0–0.1)
Basophils Relative: 0.9 % (ref 0.0–3.0)
EOS PCT: 2.2 % (ref 0.0–5.0)
Eosinophils Absolute: 0.2 10*3/uL (ref 0.0–0.7)
HCT: 46.9 % — ABNORMAL HIGH (ref 36.0–46.0)
Hemoglobin: 15.8 g/dL — ABNORMAL HIGH (ref 12.0–15.0)
Lymphocytes Relative: 21.5 % (ref 12.0–46.0)
Lymphs Abs: 2.1 10*3/uL (ref 0.7–4.0)
MCHC: 33.8 g/dL (ref 30.0–36.0)
MCV: 93.4 fl (ref 78.0–100.0)
Monocytes Absolute: 0.7 10*3/uL (ref 0.1–1.0)
Monocytes Relative: 7.6 % (ref 3.0–12.0)
Neutro Abs: 6.5 10*3/uL (ref 1.4–7.7)
Neutrophils Relative %: 67.8 % (ref 43.0–77.0)
Platelets: 359 10*3/uL (ref 150.0–400.0)
RBC: 5.02 Mil/uL (ref 3.87–5.11)
RDW: 13.4 % (ref 11.5–15.5)
WBC: 9.6 10*3/uL (ref 4.0–10.5)

## 2018-07-07 LAB — URINALYSIS, ROUTINE W REFLEX MICROSCOPIC
Bilirubin Urine: NEGATIVE
Hgb urine dipstick: NEGATIVE
KETONES UR: NEGATIVE
Leukocytes, UA: NEGATIVE
Nitrite: NEGATIVE
RBC / HPF: NONE SEEN (ref 0–?)
Specific Gravity, Urine: >=1.03 — AB (ref 1.000–1.030)
Total Protein, Urine: NEGATIVE
Urine Glucose: NEGATIVE
Urobilinogen, UA: 0.2 (ref 0.0–1.0)
pH: 5.5 (ref 5.0–8.0)

## 2018-07-07 LAB — HEPATIC FUNCTION PANEL
ALK PHOS: 74 U/L (ref 39–117)
ALT: 18 U/L (ref 0–35)
AST: 14 U/L (ref 0–37)
Albumin: 4.4 g/dL (ref 3.5–5.2)
Bilirubin, Direct: 0.1 mg/dL (ref 0.0–0.3)
Total Bilirubin: 0.7 mg/dL (ref 0.2–1.2)
Total Protein: 6.7 g/dL (ref 6.0–8.3)

## 2018-07-07 LAB — LIPID PANEL
Cholesterol: 185 mg/dL (ref 0–200)
HDL: 53.6 mg/dL (ref >39.00)
LDL Cholesterol: 113 mg/dL — ABNORMAL HIGH (ref 0–99)
NonHDL: 131.16
Total CHOL/HDL Ratio: 3
Triglycerides: 90 mg/dL (ref 0.0–149.0)
VLDL: 18 mg/dL (ref 0.0–40.0)

## 2018-07-07 LAB — BASIC METABOLIC PANEL
BUN: 23 mg/dL (ref 6–23)
CO2: 27 mEq/L (ref 19–32)
Calcium: 10.1 mg/dL (ref 8.4–10.5)
Chloride: 105 mEq/L (ref 96–112)
Creatinine, Ser: 0.82 mg/dL (ref 0.40–1.20)
GFR: 71.45 mL/min (ref >60.00)
Glucose, Bld: 92 mg/dL (ref 70–99)
Potassium: 4.5 mEq/L (ref 3.5–5.1)
Sodium: 141 mEq/L (ref 135–145)

## 2018-07-07 LAB — TSH: TSH: 0.47 u[IU]/mL (ref 0.35–4.50)

## 2018-07-07 MED ORDER — SIMVASTATIN 20 MG PO TABS: 20.0000 mg | ORAL_TABLET | Freq: Every day | ORAL | 3 refills | Status: DC

## 2018-07-07 NOTE — Progress Notes (Signed)
Subjective:    Patient ID: Whitney Moore, female    DOB: 10/01/1960, 58 y.o.   MRN: [id number]  HPI  Patient presents today for complete physical.  Immunizations:  Due for shingrix, tetanus and flu shot Diet:  Needs improvement Exercise:  Not regularly Wt Readings from Last 3 Encounters:  07/07/18 219 lb (99.3 kg)  06/12/18 219 lb (99.3 kg)  12/12/16 218 lb 6.4 oz (99.1 kg)  Colonoscopy: due Dexa: 2018 Pap Smear: due 2021 Mammogram: due Vision: up to date Dental:  Up to date  Anxiety/depression- last visit we gave her a trial of zoloft 50mg .  She reports that she took it for 2 days. Then stopped. Feeling better.   Review of Systems  Constitutional: Negative for unexpected weight change.  HENT: Negative for hearing loss and rhinorrhea.   Eyes: Negative for visual disturbance.  Respiratory: Negative for cough and shortness of breath.   Cardiovascular: Negative for chest pain and leg swelling.  Gastrointestinal: Negative for blood in stool, constipation and diarrhea.  Genitourinary: Negative for dysuria, frequency and hematuria.  Musculoskeletal: Negative for arthralgias and myalgias.  Skin: Negative for rash.  Neurological: Negative for headaches.  Hematological: Negative for adenopathy.  Psychiatric/Behavioral:       Reports improvement in anxiety/depression       Past Medical History:  Diagnosis Date  . Hyperlipidemia      Social History   Socioeconomic History  . Marital status: Married    Spouse name: Not on file  . Number of children: Not on file  . Years of education: Not on file  . Highest education level: Not on file  Occupational History  . Not on file  Social Needs  . Financial resource strain: Not on file  . Food insecurity:    Worry: Not on file    Inability: Not on file  . Transportation needs:    Medical: Not on file    Non-medical: Not on file  Tobacco Use  . Smoking status: Former Smoker    Last attempt to quit: 09/22/1997    Years  since quitting: 20.8  . Smokeless tobacco: Never Used  Substance and Sexual Activity  . Alcohol use: No  . Drug use: No  . Sexual activity: Not on file  Lifestyle  . Physical activity:    Days per week: Not on file    Minutes per session: Not on file  . Stress: Not on file  Relationships  . Social connections:    Talks on phone: Not on file    Gets together: Not on file    Attends religious service: Not on file    Active member of club or organization: Not on file    Attends meetings of clubs or organizations: Not on file    Relationship status: Not on file  . Intimate partner violence:    Fear of current or ex partner: Not on file    Emotionally abused: Not on file    Physically abused: Not on file    Forced sexual activity: Not on file  Other Topics Concern  . 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 WISDOM TOOTH EXTRACTION      Family History  Problem Relation Age of Onset  . Heart disease Mother  metal heart valve, pacemaker  . Dementia Mother     No Known Allergies  Current Outpatient Medications on File Prior to Visit  Medication Sig Dispense Refill  . Multiple Vitamin (MULTIVITAMIN) tablet Take 1 tablet by mouth daily.    . simvastatin (ZOCOR) 20 MG tablet TAKE 1 TABLET BY MOUTH AT  BEDTIME 30 tablet 0  . venlafaxine XR (EFFEXOR XR) 37.5 MG 24 hr capsule 1 tab by mouth once daily for 3 days then increase to a full tab once daily on week two. (Patient not taking: Reported on 07/07/2018) 60 capsule 0   No current facility-administered medications on file prior to visit.     BP 117/79 (BP Location: Right Arm, Patient Position: Sitting, Cuff Size: Large)   Pulse 89   Temp 98.4 F (36.9 C) (Oral)   Resp 16   Ht 5\' 5"  (1.651 m)   Wt 219 lb (99.3 kg)   LMP 09/23/2011   SpO2 97%   BMI  36.44 kg/m    Objective:   Physical Exam  Physical Exam  Constitutional: She is oriented to person, place, and time. She appears well-developed and well-nourished. No distress.  HENT:  Head: Normocephalic and atraumatic.  Right Ear: Tympanic membrane and ear canal normal.  Left Ear: Tympanic membrane and ear canal normal.  Mouth/Throat: Oropharynx is clear and moist.  Eyes: Pupils are equal, round, and reactive to light. No scleral icterus.  Neck: Normal range of motion. No thyromegaly present.  Cardiovascular: Normal rate and regular rhythm.   No murmur heard. Pulmonary/Chest: Effort normal and breath sounds normal. No respiratory distress. He has no wheezes. She has no rales. She exhibits no tenderness.  Abdominal: Soft. Bowel sounds are normal. She exhibits no distension and no mass. There is no tenderness. There is no rebound and no guarding.  Musculoskeletal: She exhibits no edema.  Lymphadenopathy:    She has no cervical adenopathy.  Neurological: She is alert and oriented to person, place, and time. She has normal patellar reflexes. She exhibits normal muscle tone. Coordination normal.  Skin: Skin is warm and dry.  Psychiatric: She has a normal mood and affect. Her behavior is normal. Judgment and thought content normal.  Breasts: Examined lying Right: Without masses, retractions, discharge or axillary adenopathy.  Left: Without masses, retractions, discharge or axillary adenopathy.  Pelvic: deferred  Assessment & Plan:   Preventative care- discussed heathy diet, exercise and weight loss. Also discussed importance of sunscreen. Refer for mammo, colo. Declines tetanus/shingrix. Flu shot given today. EKG tracing is personally reviewed.  EKG notes NSR.  No acute changes.  Due for hep c and hiv screen.  Depression- stable off of meds. Will monitor.        Assessment & Plan:

## 2018-07-07 NOTE — Patient Instructions (Signed)
Please complete lab work prior to leaving. Please work on healthy diet, regular exercise and weight loss.

## 2018-07-08 LAB — HEPATITIS C ANTIBODY
Hepatitis C Ab: NONREACTIVE
SIGNAL TO CUT-OFF: 0.01 (ref <1.00)

## 2018-07-08 LAB — HIV ANTIBODY (ROUTINE TESTING W REFLEX): HIV 1&2 Ab, 4th Generation: NONREACTIVE

## 2018-07-09 ENCOUNTER — Encounter: Payer: Self-pay | Admitting: Family

## 2018-07-10 ENCOUNTER — Ambulatory Visit (HOSPITAL_BASED_OUTPATIENT_CLINIC_OR_DEPARTMENT_OTHER)
Admission: RE | Admit: 2018-07-10 | Discharge: 2018-07-10 | Disposition: A | Discharge status: Home / Self Care | Payer: Commercial Managed Care - PPO | Source: Ambulatory Visit | Attending: Family | Admitting: Family

## 2018-07-10 DIAGNOSIS — Z0000 Encounter for general adult medical examination without abnormal findings: Secondary | ICD-10-CM | POA: Insufficient documentation

## 2018-07-15 ENCOUNTER — Encounter: Payer: Self-pay | Admitting: Family

## 2018-09-14 ENCOUNTER — Encounter: Payer: Self-pay | Admitting: Family

## 2019-06-28 ENCOUNTER — Other Ambulatory Visit: Payer: Self-pay

## 2019-06-28 ENCOUNTER — Encounter: Payer: Self-pay | Admitting: Family

## 2019-06-28 MED ORDER — SIMVASTATIN 20 MG PO TABS: 20.0000 mg | ORAL_TABLET | Freq: Every day | ORAL | 3 refills | Status: DC

## 2019-08-03 IMAGING — MG DIGITAL SCREENING BILATERAL MAMMOGRAM WITH TOMO AND CAD
6 of 10 series · 6 of 30 positions shown · non-contrast
Comparison: Previous exam(s).

CLINICAL DATA: Screening.

EXAM:
DIGITAL SCREENING BILATERAL MAMMOGRAM WITH TOMO AND CAD

[R XCCL synth-2D]
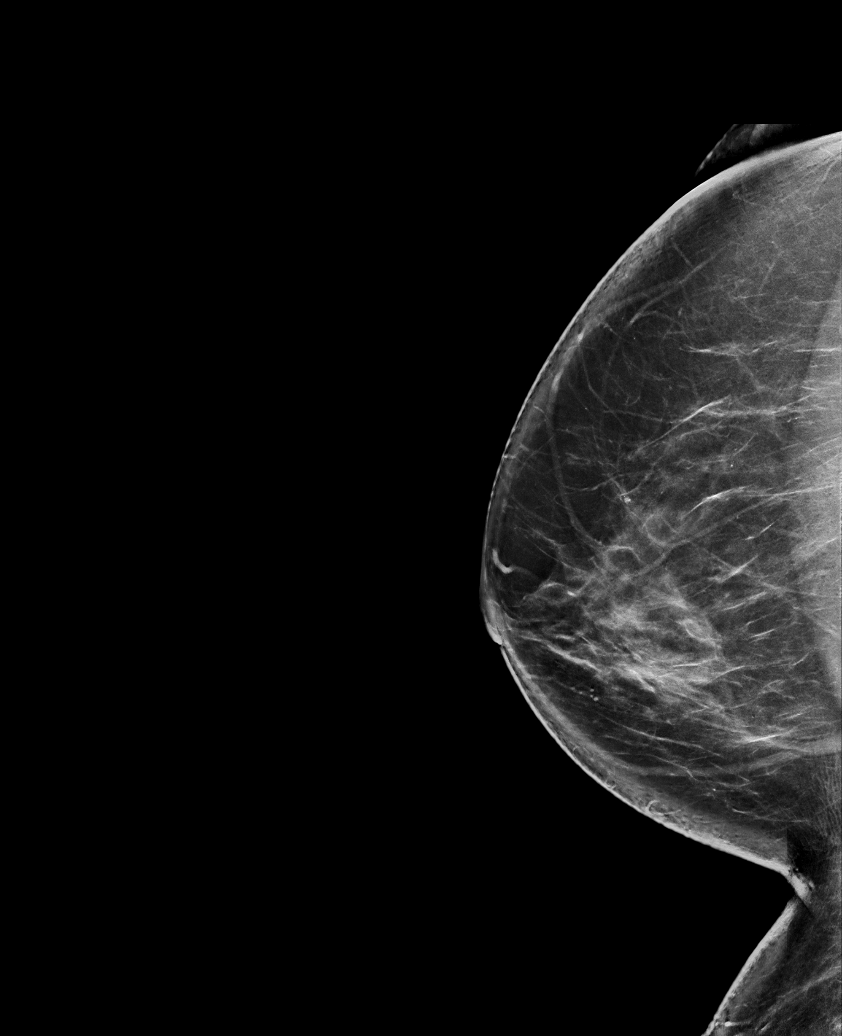

[L MLO synth-2D]
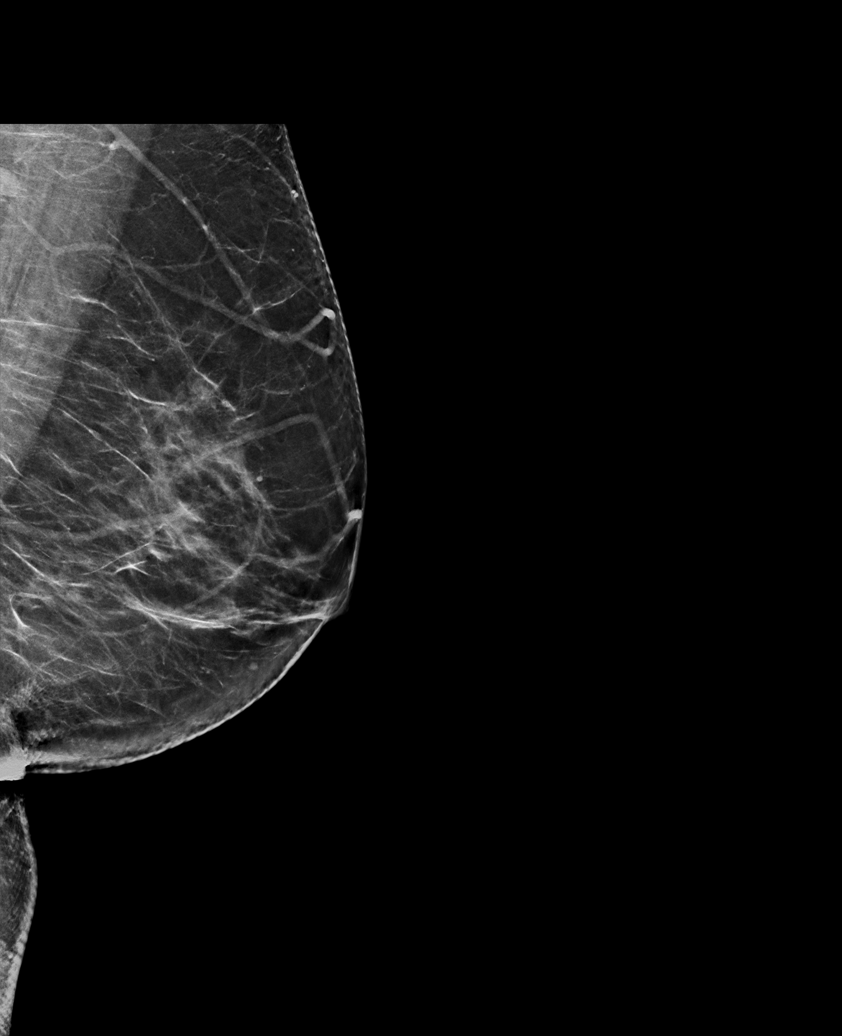

[R CC synth-2D]
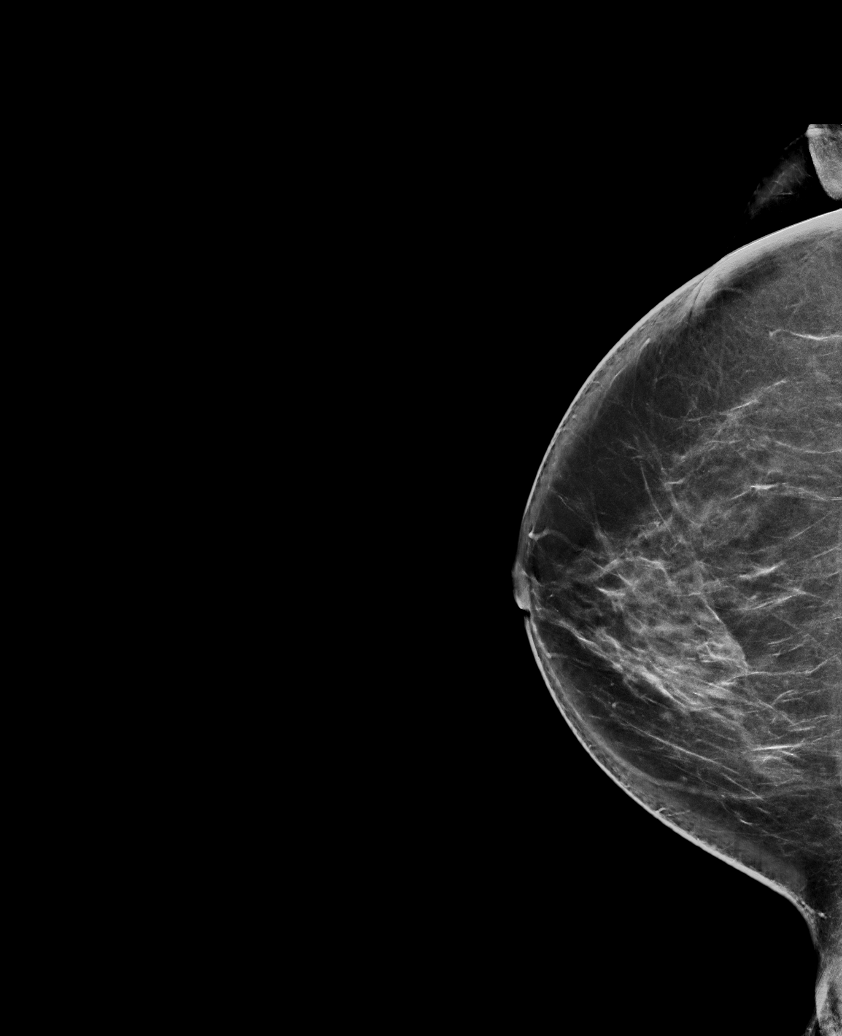

[L CC synth-2D]
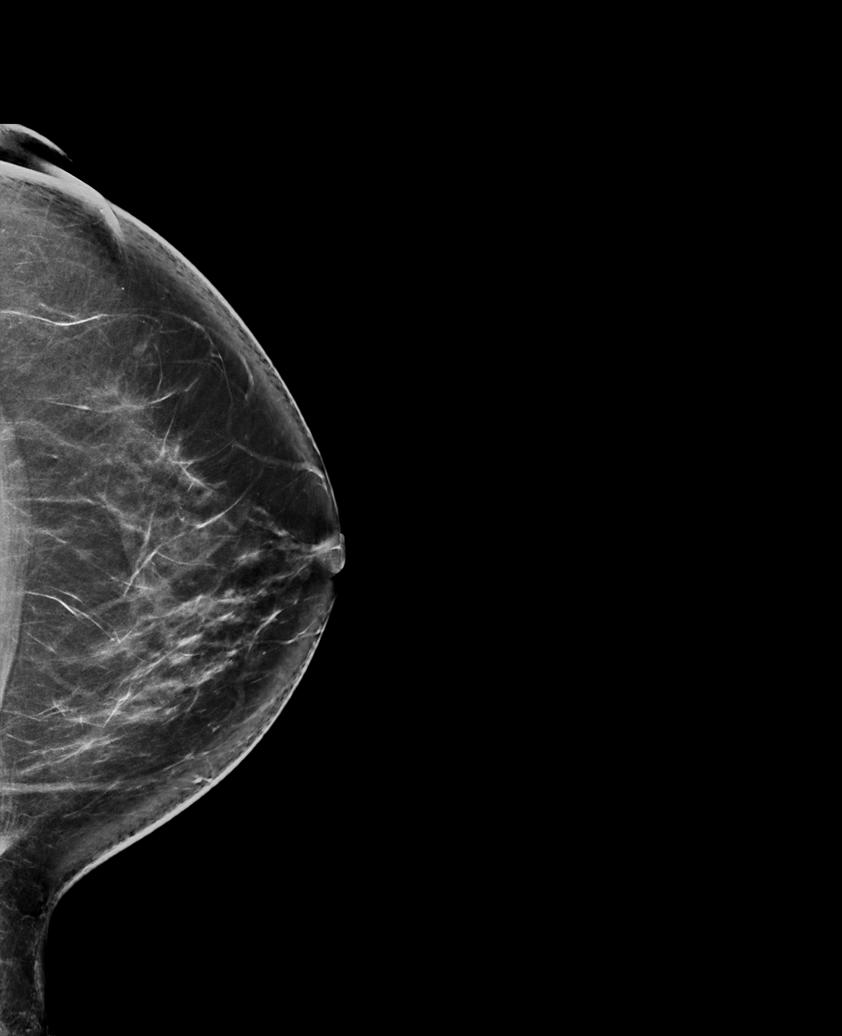

[R MLO synth-2D]
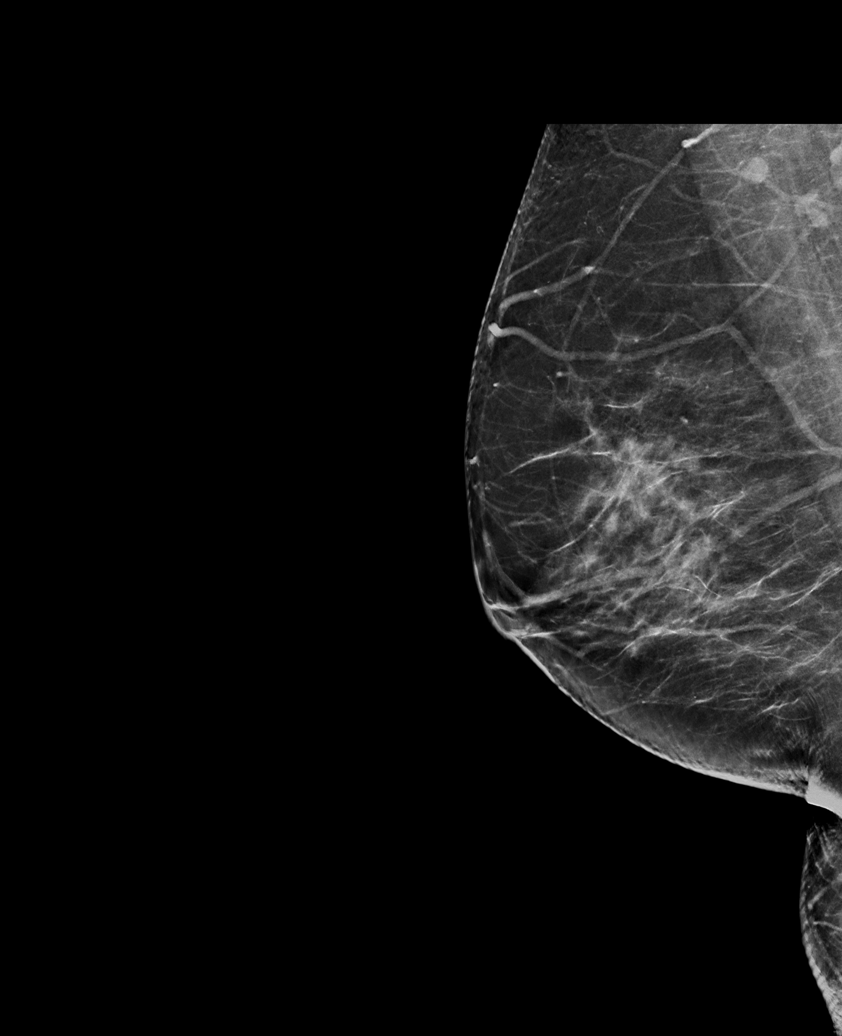

[L MLO tomo · tomo slice 37/73.0]
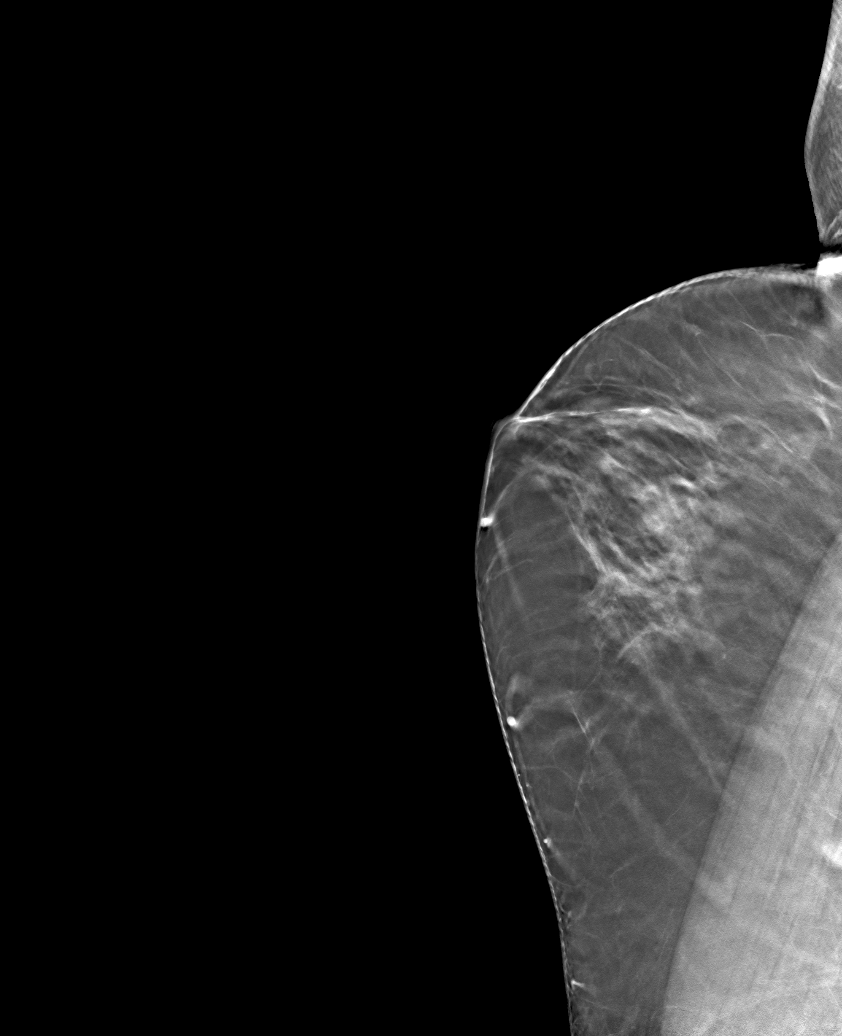

[6 of 30 positions shown; findings below may reference images not displayed]

ACR Breast Density Category c: The breast tissue is heterogeneously
dense, which may obscure small masses.
FINDINGS: There are no findings suspicious for malignancy. Images were
processed with CAD.
IMPRESSION: No mammographic evidence of malignancy. A result letter of this
screening mammogram will be mailed directly to the patient.

RECOMMENDATION:
Screening mammogram in one year. (Code:[id number])

BI-RADS CATEGORY  1: Negative.

## 2019-09-27 ENCOUNTER — Encounter: Payer: Self-pay | Admitting: Family

## 2019-09-27 MED ORDER — SIMVASTATIN 20 MG PO TABS: 20.0000 mg | ORAL_TABLET | Freq: Every day | ORAL | 3 refills | Status: DC

## 2020-07-29 ENCOUNTER — Other Ambulatory Visit: Payer: Self-pay | Admitting: Family

## 2021-04-30 ENCOUNTER — Encounter: Payer: Self-pay | Admitting: Family

## 2021-04-30 ENCOUNTER — Telehealth: Payer: Self-pay

## 2021-04-30 NOTE — Telephone Encounter (Signed)
Received refill request for simvastatin 20 mg. Lvm for patient to call for appointment, last ov Feb 2020.

## 2021-06-03 HISTORY — PX: OTHER SURGICAL HISTORY: SHX169

## 2021-08-13 ENCOUNTER — Ambulatory Visit
Admission: EM | Admit: 2021-08-13 | Discharge: 2021-08-13 | Disposition: A | Discharge status: Home / Self Care | Payer: BC Managed Care – PPO | Attending: Emergency Medicine | Admitting: Emergency Medicine

## 2021-08-13 ENCOUNTER — Other Ambulatory Visit: Payer: Self-pay

## 2021-08-13 DIAGNOSIS — J3089 Other allergic rhinitis: Secondary | ICD-10-CM | POA: Diagnosis not present

## 2021-08-13 DIAGNOSIS — J309 Allergic rhinitis, unspecified: Secondary | ICD-10-CM

## 2021-08-13 DIAGNOSIS — H1013 Acute atopic conjunctivitis, bilateral: Secondary | ICD-10-CM

## 2021-08-13 DIAGNOSIS — J302 Other seasonal allergic rhinitis: Secondary | ICD-10-CM | POA: Diagnosis not present

## 2021-08-13 MED ORDER — FLUTICASONE PROPIONATE 50 MCG/ACT NA SUSP: 2.0000 | Freq: Every day | NASAL | 2 refills | Status: DC

## 2021-08-13 MED ORDER — OLOPATADINE HCL 0.1 % OP SOLN: 1.0000 [drp] | Freq: Two times a day (BID) | OPHTHALMIC | 2 refills | Status: DC

## 2021-08-13 MED ORDER — CETIRIZINE HCL 10 MG PO TABS: 10.0000 mg | ORAL_TABLET | Freq: Every day | ORAL | 2 refills | Status: AC

## 2021-08-13 NOTE — Discharge Instructions (Addendum)
Your symptoms and my physical exam findings are concerning for exacerbation of your underlying allergies.  It is important that you are consistent with taking allergy medications exactly as prescribed.   ?  ?Zyrtec (cetirizine): This is an excellent second-generation antihistamine that helps to reduce respiratory inflammatory response to environmental allergens.  In some patients, this medication can cause daytime sleepiness so I recommend that you take 1 tablet daily at bedtime.  I have provided you with a prescription for this medication however it can be purchased over-the-counter should your insurance not include it in their formulary. ?  ?Flonase (fluticasone): This is a steroid nasal spray that you use once daily, 1 spray in each nare.  Flonase appears to be covered by your insurance while Nasonex, my personal preference of nasal steroid, is not.  If you try Flonase and you find that it is irritating, please feel free to purchase Nasonex off the shelf.  Nasal steroids work best when used daily not just only as needed.  They are considered a maintenance medication for allergy suffers such as yourself.  After 3 to 5 days of use, you will notice significant reduction of the inflammation and mucus production that is currently being caused by exposure to allergens, whether seasonal or environmental.  The most common side effect of this medication is nosebleeds.  If you experience a nosebleed, please discontinue use for 1 week, then feel free to resume.  I have provided you with a prescription but you can also purchase this medication over-the-counter if your insurance does not include it in their formulary. ? ?Pataday (olopatadine): This is an eye antihistamine that should significantly reduce swelling, scratchiness, foreign body sensation and excess tears caused by exposure to environmental allergens.  Please instill 1 drop into each eye twice daily.  If you find that once daily is sufficient, you can certainly  decrease to once daily.  I provided you with a prescription of this medication however it can be purchased over-the-counter should your insurance not include it in their formulary. ?  ?If you find that you have not had significant relief of your symptoms in the next 7 to 10 days, please follow-up with your primary care provider. ?  ?Thank you for visiting urgent care today.  We appreciate the opportunity to participate in your care. ? ?

## 2021-08-13 NOTE — ED Triage Notes (Signed)
Pt c/o allergies (itching to right eye). ?

## 2021-08-13 NOTE — ED Provider Notes (Signed)
[facility]    CSN: [id number] Arrival date & time: 08/13/21  1035    HISTORY   Chief Complaint  Patient presents with   Eye Problem   HPI Whitney Moore is a 61 y.o. female. Patient reports a history of allergies that vary with season.  Patient states that she went to the beach last weekend thinking it would help her allergies settle down but when she came back the pollen was worse and her allergies got significantly worse.  Patient states that she takes cetirizine every day, tried an over-the-counter allergy eyedrop which did not help much.  Patient denies increased nasal congestion but endorses feeling that her right eye is more itchy and uncomfortable than her left and has noticed that both of her eyes are more puffy than usual.  Patient denies excessive drainage from either eye, waking up in the morning with crustiness in her eyes.  The history is provided by the patient.  Past Medical History:  Diagnosis Date   Hyperlipidemia    Patient Active Problem List   Diagnosis Date Noted   Undiagnosed cardiac murmurs 09/23/2014   Preventative health care 09/23/2014   Hyperlipidemia 09/23/2014   Past Surgical History:  Procedure Laterality Date   WISDOM TOOTH EXTRACTION     OB History   No obstetric history on file.    Home Medications    Prior to Admission medications   Medication Sig Start Date End Date Taking? Authorizing Provider  Multiple Vitamin (MULTIVITAMIN) tablet Take 1 tablet by mouth daily.    [provider]  simvastatin (ZOCOR) 20 MG tablet TAKE 1 TABLET(20 MG) BY MOUTH DAILY AT 6 PM AND AT BEDTIME 07/29/20   Sandford Craze, NP   Family History Family History  Problem Relation Age of Onset   Heart disease Mother        metal heart valve, pacemaker   Dementia Mother    Social History Social History   Tobacco Use   Smoking status: Former    Types: Cigarettes    Quit date: 09/22/1997    Years since quitting: 23.9   Smokeless  tobacco: Never  Substance Use Topics   Alcohol use: No   Drug use: No   Allergies   Patient has no known allergies.  Review of Systems Review of Systems Pertinent findings noted in history of present illness.   Physical Exam Triage Vital Signs ED Triage Vitals  Enc Vitals Group     BP 03/30/21 0827 (!) 147/82     Pulse Rate 03/30/21 0827 72     Resp 03/30/21 0827 18     Temp 03/30/21 0827 98.3 F (36.8 C)     Temp Source 03/30/21 0827 Oral     SpO2 03/30/21 0827 98 %     Weight --      Height --      Head Circumference --      Peak Flow --      Pain Score 03/30/21 0826 5     Pain Loc --      Pain Edu? --      Excl. in GC? --   No data found.  Updated Vital Signs BP (!) 159/90 (BP Location: Right Arm)    Pulse 72    Temp 98.3 F (36.8 C) (Oral)    Resp 18    LMP 09/23/2011    SpO2 98%   Physical Exam Vitals and nursing note reviewed.  Constitutional:      General: She  is not in acute distress.    Appearance: Normal appearance. She is not ill-appearing.  HENT:     Head: Normocephalic and atraumatic.     Salivary Glands: Right salivary gland is not diffusely enlarged or tender. Left salivary gland is not diffusely enlarged or tender.     Right Ear: Ear canal and external ear normal. No drainage. A middle ear effusion is present. There is no impacted cerumen. Tympanic membrane is bulging. Tympanic membrane is not injected or erythematous.     Left Ear: Ear canal and external ear normal. No drainage. A middle ear effusion is present. There is no impacted cerumen. Tympanic membrane is bulging. Tympanic membrane is not injected or erythematous.     Ears:     Comments: Bilateral EACs normal, both TMs bulging with clear fluid    Nose: Rhinorrhea present. No nasal deformity, septal deviation, signs of injury, nasal tenderness, mucosal edema or congestion. Rhinorrhea is clear.     Right Nostril: Occlusion present. No foreign body, epistaxis or septal hematoma.     Left  Nostril: Occlusion present. No foreign body, epistaxis or septal hematoma.     Right Turbinates: Enlarged, swollen and pale.     Left Turbinates: Enlarged, swollen and pale.     Right Sinus: No maxillary sinus tenderness or frontal sinus tenderness.     Left Sinus: No maxillary sinus tenderness or frontal sinus tenderness.     Mouth/Throat:     Lips: Pink. No lesions.     Mouth: Mucous membranes are moist. No oral lesions.     Pharynx: Oropharynx is clear. Uvula midline. No posterior oropharyngeal erythema or uvula swelling.     Tonsils: No tonsillar exudate. 0 on the right. 0 on the left.     Comments: Postnasal drip Eyes:     General: Lids are normal. Lids are everted, no foreign bodies appreciated. Vision grossly intact. Gaze aligned appropriately. Allergic shiner present.        Right eye: No discharge.        Left eye: No discharge.     Extraocular Movements: Extraocular movements intact.     Conjunctiva/sclera:     Right eye: Right conjunctiva is injected. No exudate.    Left eye: Left conjunctiva is not injected. No exudate. Neck:     Trachea: Trachea and phonation normal.  Cardiovascular:     Rate and Rhythm: Normal rate and regular rhythm.     Pulses: Normal pulses.     Heart sounds: Normal heart sounds. No murmur heard.   No friction rub. No gallop.  Pulmonary:     Effort: Pulmonary effort is normal. No accessory muscle usage, prolonged expiration or respiratory distress.     Breath sounds: Normal breath sounds. No stridor, decreased air movement or transmitted upper airway sounds. No decreased breath sounds, wheezing, rhonchi or rales.  Chest:     Chest wall: No tenderness.  Musculoskeletal:        General: Normal range of motion.     Cervical back: Normal range of motion and neck supple. Normal range of motion.  Lymphadenopathy:     Cervical: No cervical adenopathy.  Skin:    General: Skin is warm and dry.     Findings: No erythema or rash.  Neurological:      General: No focal deficit present.     Mental Status: She is alert and oriented to person, place, and time.  Psychiatric:        Mood and Affect:  Mood normal.        Behavior: Behavior normal.    Visual Acuity Right Eye Distance:   Left Eye Distance:   Bilateral Distance:    Right Eye Near:   Left Eye Near:    Bilateral Near:     UC Couse / Diagnostics / Procedures:    EKG  Radiology No results found.  Procedures Procedures (including critical care time)  UC Diagnoses / Final Clinical Impressions(s)   I have reviewed the triage vital signs and the nursing notes.  Pertinent labs & imaging results that were available during my care of the patient were reviewed by me and considered in my medical decision making (see chart for details).   Final diagnoses:  Perennial allergic rhinitis with seasonal variation  Allergic conjunctivitis of both eyes  Allergic rhinitis, unspecified seasonality, unspecified trigger   Patient advised to begin Pataday eyedrops and nasal steroid.  Patient also provided with a prescription for Zyrtec should her insurance cover this.  Return precautions advised.  ED Prescriptions     Medication Sig Dispense Auth. Provider   fluticasone (FLONASE) 50 MCG/ACT nasal spray Place 2 sprays into both nostrils daily. 18 mL Theadora RamaMorgan, Rishav Rockefeller Scales, PA-C   olopatadine (PATADAY) 0.1 % ophthalmic solution Place 1 drop into both eyes 2 (two) times daily. 5 mL Theadora RamaMorgan, Abubakar Crispo Scales, PA-C   cetirizine (ZYRTEC ALLERGY) 10 MG tablet Take 1 tablet (10 mg total) by mouth at bedtime. 30 tablet Theadora RamaMorgan, Madison Albea Scales, PA-C      PDMP not reviewed this encounter.  Pending results:  Labs Reviewed - No data to display  Medications Ordered in UC: Medications - No data to display  Disposition Upon Discharge:  Condition: stable for discharge home Home: take medications as prescribed; routine discharge instructions as discussed; follow up as advised.  Patient presented  with an acute illness with associated systemic symptoms and significant discomfort requiring urgent management. In my opinion, this is a condition that a prudent lay person (someone who possesses an average knowledge of health and medicine) may potentially expect to result in complications if not addressed urgently such as respiratory distress, impairment of bodily function or dysfunction of bodily organs.   Routine symptom specific, illness specific and/or disease specific instructions were discussed with the patient and/or caregiver at length.   As such, the patient has been evaluated and assessed, work-up was performed and treatment was provided in alignment with urgent care protocols and evidence based medicine.  Patient/parent/caregiver has been advised that the patient may require follow up for further testing and treatment if the symptoms continue in spite of treatment, as clinically indicated and appropriate.  If the patient was tested for COVID-19, Influenza and/or RSV, then the patient/parent/guardian was advised to isolate at home pending the results of his/her diagnostic coronavirus test and potentially longer if theyre positive. I have also advised pt that if his/her COVID-19 test returns positive, it's recommended to self-isolate for at least 10 days after symptoms first appeared AND until fever-free for 24 hours without fever reducer AND other symptoms have improved or resolved. Discussed self-isolation recommendations as well as instructions for household member/close contacts as per the [facility] and [place] [facility], and also gave patient the COVID packet with this information.  Patient/parent/caregiver has been [facility] 3-5 days if no better; to PCP or the Emergency Department if new signs and symptoms develop, or if the current signs or symptoms continue to change or worsen for further  workup, evaluation and treatment as clinically indicated and appropriate  The patient  will follow up with their current PCP if and as advised. If the patient does not currently have a PCP we will assist them in obtaining one.   The patient may need specialty follow up if the symptoms continue, in spite of conservative treatment and management, for further workup, evaluation, consultation and treatment as clinically indicated and appropriate.  Patient/parent/caregiver verbalized understanding and agreement of plan as discussed.  All questions were addressed during visit.  Please see discharge instructions below for further details of plan.  Discharge Instructions:   Discharge Instructions      Your symptoms and my physical exam findings are concerning for exacerbation of your underlying allergies.  It is important that you are consistent with taking allergy medications exactly as prescribed.     Zyrtec (cetirizine): This is an excellent second-generation antihistamine that helps to reduce respiratory inflammatory response to environmental allergens.  In some patients, this medication can cause daytime sleepiness so I recommend that you take 1 tablet daily at bedtime.  I have provided you with a prescription for this medication however it can be purchased over-the-counter should your insurance not include it in their formulary.   Flonase (fluticasone): This is a steroid nasal spray that you use once daily, 1 spray in each nare.  Flonase appears to be covered by your insurance while Nasonex, my personal preference of nasal steroid, is not.  If you try Flonase and you find that it is irritating, please feel free to purchase Nasonex off the shelf.  Nasal steroids work best when used daily not just only as needed.  They are considered a maintenance medication for allergy suffers such as yourself.  After 3 to 5 days of use, you will notice significant reduction of the inflammation and mucus production that is currently being caused by exposure to allergens, whether seasonal or environmental.   The most common side effect of this medication is nosebleeds.  If you experience a nosebleed, please discontinue use for 1 week, then feel free to resume.  I have provided you with a prescription but you can also purchase this medication over-the-counter if your insurance does not include it in their formulary.  Pataday (olopatadine): This is an eye antihistamine that should significantly reduce swelling, scratchiness, foreign body sensation and excess tears caused by exposure to environmental allergens.  Please instill 1 drop into each eye twice daily.  If you find that once daily is sufficient, you can certainly decrease to once daily.  I provided you with a prescription of this medication however it can be purchased over-the-counter should your insurance not include it in their formulary.   If you find that you have not had significant relief of your symptoms in the next 7 to 10 days, please follow-up with your primary care provider.   Thank you for visiting urgent care today.  We appreciate the opportunity to participate in your care.     This office note has been dictated using Teaching laboratory technician.  Unfortunately, and despite my best efforts, this method of dictation can sometimes lead to occasional typographical or grammatical errors.  I apologize in advance if this occurs.     Theadora Rama Scales, PA-C 08/13/21 1155

## 2021-08-22 DIAGNOSIS — H33001 Unspecified retinal detachment with retinal break, right eye: Secondary | ICD-10-CM | POA: Diagnosis not present

## 2021-10-03 DIAGNOSIS — H33001 Unspecified retinal detachment with retinal break, right eye: Secondary | ICD-10-CM | POA: Diagnosis not present

## 2021-10-12 DIAGNOSIS — H2513 Age-related nuclear cataract, bilateral: Secondary | ICD-10-CM | POA: Diagnosis not present

## 2021-10-24 DIAGNOSIS — H2511 Age-related nuclear cataract, right eye: Secondary | ICD-10-CM | POA: Diagnosis not present

## 2021-11-09 DIAGNOSIS — H35371 Puckering of macula, right eye: Secondary | ICD-10-CM | POA: Diagnosis not present

## 2021-11-20 ENCOUNTER — Encounter: Payer: Self-pay | Admitting: Family

## 2021-11-20 ENCOUNTER — Other Ambulatory Visit (HOSPITAL_COMMUNITY)
Admission: RE | Admit: 2021-11-20 | Discharge: 2021-11-20 | Disposition: A | Discharge status: Home / Self Care | Payer: BC Managed Care – PPO | Source: Ambulatory Visit | Attending: Family | Admitting: Family

## 2021-11-20 ENCOUNTER — Ambulatory Visit (INDEPENDENT_AMBULATORY_CARE_PROVIDER_SITE_OTHER): Payer: BC Managed Care – PPO | Admitting: Family

## 2021-11-20 VITALS — BP 127/66 | HR 70 | Temp 98.2°F | Resp 16 | Ht 65.0 in | Wt 199.2 lb

## 2021-11-20 DIAGNOSIS — Z1211 Encounter for screening for malignant neoplasm of colon: Secondary | ICD-10-CM

## 2021-11-20 DIAGNOSIS — Z0000 Encounter for general adult medical examination without abnormal findings: Secondary | ICD-10-CM | POA: Diagnosis not present

## 2021-11-20 DIAGNOSIS — R011 Cardiac murmur, unspecified: Secondary | ICD-10-CM | POA: Diagnosis not present

## 2021-11-20 DIAGNOSIS — Z01419 Encounter for gynecological examination (general) (routine) without abnormal findings: Secondary | ICD-10-CM | POA: Insufficient documentation

## 2021-11-20 DIAGNOSIS — E785 Hyperlipidemia, unspecified: Secondary | ICD-10-CM | POA: Diagnosis not present

## 2021-11-20 DIAGNOSIS — Z1231 Encounter for screening mammogram for malignant neoplasm of breast: Secondary | ICD-10-CM

## 2021-11-20 LAB — LIPID PANEL
Cholesterol: 189 mg/dL (ref 0–200)
HDL: 58.4 mg/dL (ref >39.00)
LDL Cholesterol: 111 mg/dL — ABNORMAL HIGH (ref 0–99)
NonHDL: 130.49
Total CHOL/HDL Ratio: 3
Triglycerides: 95 mg/dL (ref 0.0–149.0)
VLDL: 19 mg/dL (ref 0.0–40.0)

## 2021-11-20 LAB — COMPREHENSIVE METABOLIC PANEL
ALT: 19 U/L (ref 0–35)
AST: 19 U/L (ref 0–37)
Albumin: 4.3 g/dL (ref 3.5–5.2)
Alkaline Phosphatase: 88 U/L (ref 39–117)
BUN: 14 mg/dL (ref 6–23)
CO2: 28 mEq/L (ref 19–32)
Calcium: 10.1 mg/dL (ref 8.4–10.5)
Chloride: 103 mEq/L (ref 96–112)
Creatinine, Ser: 0.74 mg/dL (ref 0.40–1.20)
GFR: 86.99 mL/min (ref >60.00)
Glucose, Bld: 86 mg/dL (ref 70–99)
Potassium: 4.1 mEq/L (ref 3.5–5.1)
Sodium: 139 mEq/L (ref 135–145)
Total Bilirubin: 0.8 mg/dL (ref 0.2–1.2)
Total Protein: 6.8 g/dL (ref 6.0–8.3)

## 2021-11-20 NOTE — Assessment & Plan Note (Signed)
Will check 2D echo for further evaluation.

## 2021-11-20 NOTE — Assessment & Plan Note (Signed)
Continue healthy diet, exercise and weight loss efforts. Pap performed today.

## 2021-11-20 NOTE — Assessment & Plan Note (Addendum)
Tolerating simvastatin.  Continue same.

## 2021-11-20 NOTE — Progress Notes (Signed)
Subjective:   By signing my name below, I, Cassell Clement, attest that this documentation has been prepared under the direction and in the presence of Sandford Craze NP, 11/20/2021    Patient ID: Whitney Moore, female    DOB: Nov 09, 1960, 61 y.o.   MRN: [id number]  Chief Complaint  Patient presents with   Annual Exam         HPI Patient is in today for a comprehensive physical exam.   Weight: She reports that she is losing weight Wt Readings from Last 3 Encounters:  11/20/21 199 lb 3.2 oz (90.4 kg)  07/07/18 219 lb (99.3 kg)  06/12/18 219 lb (99.3 kg)  Hernia: She reports that in 07/2021, she developed a hernia in her left lower groin area. She notices that symptoms worsen when she bends down. She doesn't notice any symptoms when she's walking.  Allergies: She reports that her allergies are worsening   She denies having any fever, ear pain, new muscle pain, joint pain, new moles, rashes, congestion, sinus pain, sore throat, palpations, wheezing, n/v/d, constipation, blood in stool, dysuria, frequency, hematuria, headaches, depresssion or anxiety at this time.  Social History: She reports that she undergone surgery for a detached retina in her right eye in 08/2021. She also reports that she had cataract surgery in 10/2021. She states that her mother recently passed away.  Colonoscopy: She is overdue for a colonoscopy.  Dexa: Last completed on 01/07/2017 Pap Smear: Last completed on 12/12/2016. She is interested in getting a pap smear during today's visit.  Mammogram: Last completed on 07/10/2018 Immunizations: She has not received the Shingles vaccine. She is not interested in receiving the vaccine during today's visit. She is overdue for her tetanus vaccine. She is also not interested in receiving the tetanus vaccine. She reports that she has had 3 Covid-19 vaccines. She also reports that she had a booster Covid-19 shot in 07/2021 Diet: She eats a healthy diet. She consumes  mostly fish and chicken. She drinks a protein shake in the morning.   Health Maintenance Due  Topic Date Due   COVID-19 Vaccine (1) Never done   COLONOSCOPY (Pts 45-46yrs Insurance coverage will need to be confirmed)  Never done   Zoster Vaccines- Shingrix (1 of 2) Never done   TETANUS/TDAP  09/20/2017   MAMMOGRAM  07/11/2019   PAP SMEAR-Modifier  12/13/2019    Past Medical History:  Diagnosis Date   Detached retina, right    Hyperlipidemia     Past Surgical History:  Procedure Laterality Date   cataract surgery     5/23   retina sugery Right 2023   laser with gas bubble   WISDOM TOOTH EXTRACTION      Family History  Problem Relation Age of Onset   Heart disease Mother        metal heart valve, pacemaker   Dementia Mother     Social History   Socioeconomic History   Marital status: Married    Spouse name: Not on file   Number of children: Not on file   Years of education: Not on file   Highest education level: Not on file  Occupational History   Not on file  Tobacco Use   Smoking status: Former    Types: Cigarettes    Quit date: 09/22/1997    Years since quitting: 24.1   Smokeless tobacco: Never  Substance and Sexual Activity   Alcohol use: No   Drug use: No   Sexual activity: Not  on file  Other Topics Concern   Not on file  Social History Narrative   Working for [employer]   No children   She enjoys reading, crocheting   Married   Social Determinants of Corporate investment banker Strain: Not on file  Food Insecurity: Not on file  Transportation Needs: Not on file  Physical Activity: Not on file  Stress: Not on file  Social Connections: Not on file  Intimate Partner Violence: Not on file    Outpatient Medications Prior to Visit  Medication Sig Dispense Refill   fluticasone (FLONASE) 50 MCG/ACT nasal spray Place 2 sprays into both nostrils daily. 18 mL 2   Multiple Vitamin (MULTIVITAMIN) tablet Take 1 tablet by mouth daily.     prednisoLONE  acetate (PRED FORTE) 1 % ophthalmic suspension SMARTSIG:In Eye(s)     simvastatin (ZOCOR) 20 MG tablet TAKE 1 TABLET(20 MG) BY MOUTH DAILY AT 6 PM AND AT BEDTIME 90 tablet 3   cetirizine (ZYRTEC ALLERGY) 10 MG tablet Take 1 tablet (10 mg total) by mouth at bedtime. 30 tablet 2   olopatadine (PATADAY) 0.1 % ophthalmic solution Place 1 drop into both eyes 2 (two) times daily. 5 mL 2   No facility-administered medications prior to visit.    No Known Allergies  Review of Systems  Constitutional:  Negative for fever.  HENT:  Negative for congestion, ear pain, sinus pain and sore throat.   Respiratory:  Negative for wheezing.   Cardiovascular:  Negative for palpitations.  Gastrointestinal:  Negative for blood in stool, constipation, diarrhea, nausea and vomiting.  Genitourinary:  Negative for dysuria, frequency and hematuria.  Musculoskeletal:  Negative for joint pain and myalgias.  Skin:        (-) [place]  Neurological:  Negative for headaches.  Psychiatric/Behavioral:  Negative for depression. The patient is not nervous/anxious.        Objective:    Physical Exam Constitutional:      General: She is not in acute distress.    Appearance: Normal appearance. She is not ill-appearing.  HENT:     Head: Normocephalic and atraumatic.     Right Ear: Tympanic membrane, ear canal and external ear normal.     Left Ear: Tympanic membrane, ear canal and external ear normal.  Eyes:     Extraocular Movements: Extraocular movements intact.     Pupils: Pupils are equal, round, and reactive to light.  Cardiovascular:     Rate and Rhythm: Normal rate and regular rhythm.     Heart sounds: Murmur heard.     Systolic murmur is present with a grade of 2/6.     No gallop.  Pulmonary:     Effort: Pulmonary effort is normal. No respiratory distress.     Breath sounds: Normal breath sounds. No wheezing or rales.  Chest:  Breasts:    Breasts are symmetrical.     Right: No inverted nipple or mass.      Left: No inverted nipple or mass.  Abdominal:     General: Bowel sounds are normal. There is no distension.     Palpations: Abdomen is soft.     Tenderness: There is no abdominal tenderness. There is no guarding.  Genitourinary:    General: Normal vulva.     Rectum: Normal.  Skin:    General: Skin is warm and dry.  Neurological:     Mental Status: She is alert and oriented to person, place, and time.  Psychiatric:  Mood and Affect: Mood normal.        Behavior: Behavior normal.        Judgment: Judgment normal.     BP 127/66 (BP Location: Right Arm, Patient Position: Sitting, Cuff Size: Large)   Pulse 70   Temp 98.2 F (36.8 C) (Oral)   Resp 16   Ht $R'5\' 5"'GD$  (1.651 m)   Wt 199 lb 3.2 oz (90.4 kg)   LMP 09/23/2011   SpO2 100%   BMI 33.15 kg/m  Wt Readings from Last 3 Encounters:  11/20/21 199 lb 3.2 oz (90.4 kg)  07/07/18 219 lb (99.3 kg)  06/12/18 219 lb (99.3 kg)       Assessment & Plan:   Problem List Items Addressed This Visit       Unprioritized   Undiagnosed cardiac murmurs    Will check 2D echo for further evaluation.       Preventative health care - Primary    Continue healthy diet, exercise and weight loss efforts. Pap performed today.       Hyperlipidemia    Tolerating simvastatin.  Continue same.       Relevant Orders   Lipid panel   Comp Met (CMET)   Other Visit Diagnoses     Encounter for routine gynecological examination with Papanicolaou smear of cervix       Relevant Orders   Cytology - PAP( [place])   Colon cancer screening       Relevant Orders   Ambulatory referral to Gastroenterology   Encounter for screening mammogram for malignant neoplasm of breast       Relevant Orders   MM 3D SCREEN BREAST BILATERAL   Systolic murmur       Relevant Orders   ECHOCARDIOGRAM COMPLETE        No orders of the defined types were placed in this encounter.   I, Nance Pear, NP, personally preformed the services  described in this documentation.  All medical record entries made by the scribe were at my direction and in my presence.  I have reviewed the chart and discharge instructions (if applicable) and agree that the record reflects my personal performance and is accurate and complete. 11/20/2021  I,Amber Collins,acting as a scribe for Nance Pear, NP.,have documented all relevant documentation on the behalf of Nance Pear, NP,as directed by  Nance Pear, NP while in the presence of Nance Pear, NP.  Nance Pear, NP

## 2021-11-21 ENCOUNTER — Telehealth (HOSPITAL_BASED_OUTPATIENT_CLINIC_OR_DEPARTMENT_OTHER): Payer: Self-pay

## 2021-11-21 LAB — CYTOLOGY - PAP
Comment: NEGATIVE
Diagnosis: NEGATIVE
High risk HPV: NEGATIVE

## 2021-11-28 ENCOUNTER — Ambulatory Visit (HOSPITAL_BASED_OUTPATIENT_CLINIC_OR_DEPARTMENT_OTHER)
Admission: RE | Admit: 2021-11-28 | Discharge: 2021-11-28 | Disposition: A | Discharge status: Home / Self Care | Payer: BC Managed Care – PPO | Source: Ambulatory Visit | Attending: Family | Admitting: Family

## 2021-11-28 ENCOUNTER — Encounter (HOSPITAL_BASED_OUTPATIENT_CLINIC_OR_DEPARTMENT_OTHER): Payer: Self-pay

## 2021-11-28 DIAGNOSIS — Z1231 Encounter for screening mammogram for malignant neoplasm of breast: Secondary | ICD-10-CM | POA: Diagnosis not present

## 2021-11-29 ENCOUNTER — Encounter: Payer: Self-pay | Admitting: Family

## 2021-11-30 MED ORDER — SIMVASTATIN 20 MG PO TABS: ORAL_TABLET | ORAL | 1 refills | Status: DC

## 2021-12-11 DIAGNOSIS — H35371 Puckering of macula, right eye: Secondary | ICD-10-CM | POA: Diagnosis not present

## 2021-12-19 DIAGNOSIS — H35371 Puckering of macula, right eye: Secondary | ICD-10-CM | POA: Diagnosis not present

## 2021-12-20 ENCOUNTER — Other Ambulatory Visit (HOSPITAL_BASED_OUTPATIENT_CLINIC_OR_DEPARTMENT_OTHER): Payer: BC Managed Care – PPO

## 2021-12-25 ENCOUNTER — Ambulatory Visit (INDEPENDENT_AMBULATORY_CARE_PROVIDER_SITE_OTHER): Payer: BC Managed Care – PPO

## 2021-12-25 DIAGNOSIS — Z23 Encounter for immunization: Secondary | ICD-10-CM | POA: Diagnosis not present

## 2021-12-25 NOTE — Progress Notes (Signed)
61 yr old female is here today for 1 st shingles vaccine. Pt was given 0.5 mL of shingrix in left deltoid. Pt tolerated well  Pt is scheduled for 2nd shot 03/27/22

## 2022-01-30 DIAGNOSIS — H35371 Puckering of macula, right eye: Secondary | ICD-10-CM | POA: Diagnosis not present

## 2022-02-11 DIAGNOSIS — H26491 Other secondary cataract, right eye: Secondary | ICD-10-CM | POA: Diagnosis not present

## 2022-03-20 DIAGNOSIS — H35371 Puckering of macula, right eye: Secondary | ICD-10-CM | POA: Diagnosis not present

## 2022-03-27 ENCOUNTER — Ambulatory Visit (INDEPENDENT_AMBULATORY_CARE_PROVIDER_SITE_OTHER): Payer: BC Managed Care – PPO

## 2022-03-27 DIAGNOSIS — Z23 Encounter for immunization: Secondary | ICD-10-CM

## 2022-03-27 NOTE — Progress Notes (Signed)
Whitney Moore is a 61 y.o. female presents to the office today for 2nd Shingles injections, per physician's orders. Original order: 11/20/21 per Debbrah Alar  Shingles (med),left deltoid(route) was administered today. Patient tolerated injection.    [name] [name]       61 yr old female is here today for 1 st shingles vaccine. Pt was given 0.5 mL of shingrix in left deltoid. Pt tolerated well  Pt is scheduled for 2nd shot 03/27/22

## 2022-05-01 DIAGNOSIS — H35351 Cystoid macular degeneration, right eye: Secondary | ICD-10-CM | POA: Diagnosis not present

## 2022-05-02 ENCOUNTER — Other Ambulatory Visit: Payer: Self-pay | Admitting: *Deleted

## 2022-05-02 MED ORDER — SIMVASTATIN 20 MG PO TABS: ORAL_TABLET | ORAL | 0 refills | Status: DC

## 2022-08-19 ENCOUNTER — Encounter: Payer: Self-pay | Admitting: Family

## 2022-08-28 ENCOUNTER — Ambulatory Visit: Payer: BC Managed Care – PPO | Admitting: Family

## 2022-08-29 ENCOUNTER — Other Ambulatory Visit: Payer: Self-pay

## 2022-08-29 ENCOUNTER — Encounter: Payer: Self-pay | Admitting: Family

## 2022-08-29 ENCOUNTER — Other Ambulatory Visit: Payer: Self-pay | Admitting: Family

## 2022-08-29 ENCOUNTER — Other Ambulatory Visit (HOSPITAL_COMMUNITY): Payer: Self-pay

## 2022-08-29 MED ORDER — SIMVASTATIN 20 MG PO TABS: ORAL_TABLET | ORAL | 0 refills | Status: DC

## 2022-08-29 MED ORDER — SIMVASTATIN 20 MG PO TABS
ORAL_TABLET | ORAL | 0 refills | Status: DC
  Filled 2022-08-29: qty 30, 30d supply, fill #0

## 2022-11-18 ENCOUNTER — Encounter: Payer: Self-pay | Admitting: *Deleted

## 2022-11-18 ENCOUNTER — Other Ambulatory Visit: Payer: Self-pay | Admitting: *Deleted

## 2022-11-18 MED ORDER — SIMVASTATIN 20 MG PO TABS: ORAL_TABLET | ORAL | 0 refills | Status: DC

## 2023-03-07 ENCOUNTER — Other Ambulatory Visit: Payer: Self-pay | Admitting: Family

## 2023-03-07 NOTE — Telephone Encounter (Signed)
Pt called and was scheduled for CPE on 10.30.24. Pt would like to look into a refill of this medication to get her to this appt.

## 2023-03-11 ENCOUNTER — Encounter: Payer: Self-pay | Admitting: Family

## 2023-03-11 MED ORDER — SIMVASTATIN 20 MG PO TABS: ORAL_TABLET | ORAL | 0 refills | Status: DC

## 2023-03-18 NOTE — Telephone Encounter (Signed)
Rx was sent

## 2023-04-02 ENCOUNTER — Ambulatory Visit (INDEPENDENT_AMBULATORY_CARE_PROVIDER_SITE_OTHER): Payer: BC Managed Care – PPO | Admitting: Family

## 2023-04-02 ENCOUNTER — Encounter: Payer: Self-pay | Admitting: Family

## 2023-04-02 VITALS — BP 135/68 | HR 66 | Temp 97.5°F | Resp 16 | Ht 65.0 in | Wt 198.0 lb

## 2023-04-02 DIAGNOSIS — Z1231 Encounter for screening mammogram for malignant neoplasm of breast: Secondary | ICD-10-CM

## 2023-04-02 DIAGNOSIS — E785 Hyperlipidemia, unspecified: Secondary | ICD-10-CM | POA: Diagnosis not present

## 2023-04-02 DIAGNOSIS — Z0000 Encounter for general adult medical examination without abnormal findings: Secondary | ICD-10-CM

## 2023-04-02 DIAGNOSIS — D75839 Thrombocytosis, unspecified: Secondary | ICD-10-CM

## 2023-04-02 LAB — CBC WITH DIFFERENTIAL/PLATELET
Basophils Absolute: 0.1 10*3/uL (ref 0.0–0.1)
Basophils Relative: 1.9 % (ref 0.0–3.0)
Eosinophils Absolute: 0.2 10*3/uL (ref 0.0–0.7)
Eosinophils Relative: 3 % (ref 0.0–5.0)
HCT: 47.3 % — ABNORMAL HIGH (ref 36.0–46.0)
Hemoglobin: 15.3 g/dL — ABNORMAL HIGH (ref 12.0–15.0)
Lymphocytes Relative: 29.7 % (ref 12.0–46.0)
Lymphs Abs: 1.9 10*3/uL (ref 0.7–4.0)
MCHC: 32.3 g/dL (ref 30.0–36.0)
MCV: 95.7 fL (ref 78.0–100.0)
Monocytes Absolute: 0.5 10*3/uL (ref 0.1–1.0)
Monocytes Relative: 8.4 % (ref 3.0–12.0)
Neutro Abs: 3.7 10*3/uL (ref 1.4–7.7)
Neutrophils Relative %: 57 % (ref 43.0–77.0)
Platelets: 329 10*3/uL (ref 150.0–400.0)
RBC: 4.94 Mil/uL (ref 3.87–5.11)
RDW: 13.8 % (ref 11.5–15.5)
WBC: 6.6 10*3/uL (ref 4.0–10.5)

## 2023-04-02 LAB — COMPREHENSIVE METABOLIC PANEL
ALT: 16 U/L (ref 0–35)
AST: 17 U/L (ref 0–37)
Albumin: 4.4 g/dL (ref 3.5–5.2)
Alkaline Phosphatase: 87 U/L (ref 39–117)
BUN: 16 mg/dL (ref 6–23)
CO2: 28 meq/L (ref 19–32)
Calcium: 10 mg/dL (ref 8.4–10.5)
Chloride: 103 meq/L (ref 96–112)
Creatinine, Ser: 0.84 mg/dL (ref 0.40–1.20)
GFR: 74 mL/min (ref >60.00)
Glucose, Bld: 90 mg/dL (ref 70–99)
Potassium: 4.2 meq/L (ref 3.5–5.1)
Sodium: 140 meq/L (ref 135–145)
Total Bilirubin: 1.1 mg/dL (ref 0.2–1.2)
Total Protein: 6.9 g/dL (ref 6.0–8.3)

## 2023-04-02 LAB — LIPID PANEL
Cholesterol: 200 mg/dL (ref 0–200)
HDL: 75.2 mg/dL (ref >39.00)
LDL Cholesterol: 107 mg/dL — ABNORMAL HIGH (ref 0–99)
NonHDL: 124.82
Total CHOL/HDL Ratio: 3
Triglycerides: 88 mg/dL (ref 0.0–149.0)
VLDL: 17.6 mg/dL (ref 0.0–40.0)

## 2023-04-02 NOTE — Patient Instructions (Signed)
VISIT SUMMARY:  During your routine physical examination, we reviewed your eye health, inguinal hernia, immunizations, screenings, and lab work. You report your vision is stable at 20/25 in the right eye despite some visual distortion. We discussed your hernia, which is currently not causing significant discomfort. We also reviewed your immunization status and screening tests, and noted a slightly high hemoglobin level.  YOUR PLAN:  -EYE HEALTH: You have a history of multiple eye surgeries, including detached retina repair, cataract surgery, and scar tissue removal. Your vision is currently 20/25 in the affected eye, with some visual distortion. Continue regular follow-ups with your ophthalmologist to monitor your eye health.  -INGUINAL HERNIA: An inguinal hernia occurs when tissue pushes through a weak spot in the abdominal muscles. You are currently not experiencing significant discomfort, but you should continue to monitor it. Surgery may be considered if symptoms worsen.  -IMMUNIZATIONS: You are up to date on your shingles vaccine. However, you declined the tetanus and flu shots today. Please schedule your tetanus shot after your vacation.  -SCREENINGS: You are due for a mammogram and need to schedule it after this visit. Your colonoscopy, which was rescheduled due to a conflict with an eye procedure, should be done in January.  -LAB WORK: Your hemoglobin level is slightly high. We will order an iron level test to rule out hemochromatosis, a condition where your body absorbs too much iron.   INSTRUCTIONS:  Please schedule your tetanus shot after your vacation. Remember to schedule your mammogram and colonoscopy as discussed. Follow up in 1 year unless you need to see [place] sooner.

## 2023-04-02 NOTE — Assessment & Plan Note (Signed)
Immunizations Tetanus due, but patient declined due to upcoming vacation. Patient declined flu shot. Shingles vaccine up to date. -Schedule tetanus shot after patient's vacation.  Screenings Mammogram due. Patient will schedule after the visit. Colonoscopy rescheduled due to conflict with eye procedure, to be done in January. -Order mammogram. -Remind patient to schedule colonoscopy in January.

## 2023-04-02 NOTE — Progress Notes (Signed)
Subjective:     Patient ID: Whitney Moore, female    DOB: [birth year], 62 y.o.   MRN: [id number]  Chief Complaint  Patient presents with   Annual Exam    HPI  Discussed the use of AI scribe software for clinical note transcription with the patient, who gave verbal consent to proceed.  History of Present Illness   Whitney Moore, a patient with a history of eye problems, presents for a routine physical examination. She reports a series of eye surgeries over the past year and a half, including a detached retina repair, cataract surgery, scar tissue removal, and steroid injections. Despite these interventions, she still experiences some visual distortion, described as "a sentence running across a wavy flag." However, her vision is currently 20/25 in the affected eye.  Whitney Moore also mentions a hernia, which is occasionally sensitive but does not cause significant discomfort. She has a heart murmur, but no other cardiac symptoms are reported. She has maintained a 40-pound weight loss for three years and is actively trying to lose an additional 10 pounds. She reports a daily step count of approximately 15,000 and maintains a diet rich in salads and protein.      Immunizations: declines tetanus today, declines flu shot Diet:  eats a lot of salads and protein Wt Readings from Last 3 Encounters:  04/02/23 198 lb (89.8 kg)  11/20/21 199 lb 3.2 oz (90.4 kg)  07/07/18 219 lb (99.3 kg)  Exercise:  stays active at work, some walking Colonoscopy: due she plans to schedule for January Dexa: normal 2019 Pap Smear: 2023  Mammogram: schedule Vision: up to date Dental: scheduled   Health Maintenance Due  Topic Date Due   Colonoscopy  Never done   DTaP/Tdap/Td (2 - Td or Tdap) 09/20/2017   MAMMOGRAM  11/29/2022   COVID-19 Vaccine (4 - 2023-24 season) 02/02/2023    Past Medical History:  Diagnosis Date   Detached retina, right    Hyperlipidemia     Past Surgical History:  Procedure Laterality  Date   cataract surgery     5/23   retina sugery Right 2023   laser with gas bubble   WISDOM TOOTH EXTRACTION      Family History  Problem Relation Age of Onset   Heart disease Mother        metal heart valve, pacemaker   Dementia Mother     Social History   Socioeconomic History   Marital status: Married    Spouse name: Not on file   Number of children: Not on file   Years of education: Not on file   Highest education level: Not on file  Occupational History   Not on file  Tobacco Use   Smoking status: Former    Current packs/day: 0.00    Types: Cigarettes    Quit date: 09/22/1997    Years since quitting: 25.5   Smokeless tobacco: Never  Substance and Sexual Activity   Alcohol use: No   Drug use: No   Sexual activity: Not on file  Other Topics Concern   Not on file  Social History Narrative   Working for [employer]   No children   She enjoys reading, crocheting   Married   Social Determinants of Corporate investment banker Strain: Not on file  Food Insecurity: Not on file  Transportation Needs: Not on file  Physical Activity: Not on file  Stress: Not on file  Social Connections: Not on file  Intimate Partner Violence: Not  on file    Outpatient Medications Prior to Visit  Medication Sig Dispense Refill   Multiple Vitamin (MULTIVITAMIN) tablet Take 1 tablet by mouth daily.     simvastatin (ZOCOR) 20 MG tablet TAKE 1 TABLET(20 MG) BY MOUTH DAILY AT 6 PM AND AT BEDTIME 90 tablet 0   cetirizine (ZYRTEC ALLERGY) 10 MG tablet Take 1 tablet (10 mg total) by mouth at bedtime. 30 tablet 2   fluticasone (FLONASE) 50 MCG/ACT nasal spray Place 2 sprays into both nostrils daily. 18 mL 2   prednisoLONE acetate (PRED FORTE) 1 % ophthalmic suspension SMARTSIG:In Eye(s)     No facility-administered medications prior to visit.    No Known Allergies  Review of Systems  Constitutional:  Positive for weight loss.  HENT:  Negative for hearing loss.   Eyes:  Negative for  blurred vision.  Cardiovascular:  Negative for leg swelling.  Gastrointestinal:  Negative for constipation and diarrhea.  Genitourinary:  Negative for dysuria and frequency.  Musculoskeletal:  Negative for joint pain and myalgias.  Skin:  Negative for rash.  Neurological:  Negative for headaches.  Psychiatric/Behavioral:         Denies depression/anxiety       Objective:    Physical Exam   BP 135/68 (BP Location: Right Arm, Patient Position: Sitting, Cuff Size: Normal)   Pulse 66   Temp (!) 97.5 F (36.4 C) (Oral)   Resp 16   Ht 5\' 5"  (1.651 m)   Wt 198 lb (89.8 kg)   LMP 09/23/2011   SpO2 100%   BMI 32.95 kg/m  Wt Readings from Last 3 Encounters:  04/02/23 198 lb (89.8 kg)  11/20/21 199 lb 3.2 oz (90.4 kg)  07/07/18 219 lb (99.3 kg)   Physical Exam  Constitutional: She is oriented to person, place, and time. She appears well-developed and well-nourished. No distress.  HENT:  Head: Normocephalic and atraumatic.  Right Ear: Tympanic membrane and ear canal normal.  Left Ear: Tympanic membrane and ear canal normal.  Mouth/Throat: Oropharynx is clear and moist.  Eyes: Pupils are equal, round, and reactive to light. No scleral icterus.  Neck: Normal range of motion. No thyromegaly present.  Cardiovascular: Normal rate and regular rhythm.   2+ systolic murmur heard. Pulmonary/Chest: Effort normal and breath sounds normal. No respiratory distress. He has no wheezes. She has no rales. She exhibits no tenderness.  Abdominal: Soft. Bowel sounds are normal. She exhibits no distension and no mass. There is no tenderness. There is no rebound and no guarding.  Musculoskeletal: She exhibits no edema.  Lymphadenopathy:    She has no cervical adenopathy.  Neurological: She is alert and oriented to person, place, and time. She has normal patellar reflexes. She exhibits normal muscle tone. Coordination normal.  Skin: Skin is warm and dry.  Psychiatric: She has a normal mood and  affect. Her behavior is normal. Judgment and thought content normal.  Breasts: Examined lying Right: Without masses, retractions, discharge or axillary adenopathy.  Left: Without masses, retractions, discharge or axillary adenopathy.  Pelvic: deferred          Assessment & Plan:       Assessment & Plan:   Problem List Items Addressed This Visit       Unprioritized   Preventative health care - Primary    Immunizations Tetanus due, but patient declined due to upcoming vacation. Patient declined flu shot. Shingles vaccine up to date. -Schedule tetanus shot after patient's vacation.  Screenings Mammogram due. Patient  will schedule after the visit. Colonoscopy rescheduled due to conflict with eye procedure, to be done in January. -Order mammogram. -Remind patient to schedule colonoscopy in January.      Hyperlipidemia   Relevant Orders   Comp Met (CMET)   Lipid panel   Other Visit Diagnoses     Breast cancer screening by mammogram       Relevant Orders   MM 3D SCREENING MAMMOGRAM BILATERAL BREAST   Thrombocytosis       Relevant Orders   CBC w/Diff   Iron, TIBC and Ferritin Panel       I have discontinued Amala Buzzell's fluticasone and prednisoLONE acetate. I am also having her maintain her multivitamin, cetirizine, and simvastatin.  No orders of the defined types were placed in this encounter.

## 2023-04-03 LAB — IRON,TIBC AND FERRITIN PANEL
%SAT: 42 % (ref 16–45)
Ferritin: 186 ng/mL (ref 16–288)
Iron: 139 ug/dL (ref 45–160)
TIBC: 333 ug/dL (ref 250–450)

## 2023-04-17 ENCOUNTER — Encounter (HOSPITAL_BASED_OUTPATIENT_CLINIC_OR_DEPARTMENT_OTHER): Payer: Self-pay

## 2023-04-17 ENCOUNTER — Ambulatory Visit (HOSPITAL_BASED_OUTPATIENT_CLINIC_OR_DEPARTMENT_OTHER)
Admission: RE | Admit: 2023-04-17 | Discharge: 2023-04-17 | Disposition: A | Discharge status: Home / Self Care | Payer: BC Managed Care – PPO | Source: Ambulatory Visit | Attending: Family | Admitting: Family

## 2023-04-17 DIAGNOSIS — Z1231 Encounter for screening mammogram for malignant neoplasm of breast: Secondary | ICD-10-CM | POA: Diagnosis not present

## 2023-06-05 ENCOUNTER — Other Ambulatory Visit: Payer: Self-pay | Admitting: Family

## 2023-06-05 MED ORDER — SIMVASTATIN 20 MG PO TABS: 20.0000 mg | ORAL_TABLET | Freq: Every day | ORAL | 2 refills | Status: DC

## 2024-02-17 ENCOUNTER — Other Ambulatory Visit: Payer: Self-pay

## 2024-02-17 MED ORDER — SIMVASTATIN 20 MG PO TABS: 20.0000 mg | ORAL_TABLET | Freq: Every day | ORAL | 2 refills | Status: AC

## 2024-04-02 ENCOUNTER — Encounter: Payer: BC Managed Care – PPO | Admitting: Family
